# Patient Record
Sex: Male | Born: 1970 | Race: White | Hispanic: No | Marital: Married | Smoking: Never smoker
Health system: Southern US, Community
[De-identification: ages and names within clinical notes are randomized; demographics above are authoritative.]

## PROBLEM LIST (undated history)

## (undated) DIAGNOSIS — K219 Gastro-esophageal reflux disease without esophagitis: Secondary | ICD-10-CM

## (undated) DIAGNOSIS — R51 Headache: Secondary | ICD-10-CM

## (undated) DIAGNOSIS — F329 Major depressive disorder, single episode, unspecified: Secondary | ICD-10-CM

## (undated) DIAGNOSIS — H5440 Blindness, one eye, unspecified eye: Secondary | ICD-10-CM

## (undated) DIAGNOSIS — T7840XA Allergy, unspecified, initial encounter: Secondary | ICD-10-CM

## (undated) DIAGNOSIS — F32A Depression, unspecified: Secondary | ICD-10-CM

## (undated) DIAGNOSIS — R519 Headache, unspecified: Secondary | ICD-10-CM

## (undated) DIAGNOSIS — H269 Unspecified cataract: Secondary | ICD-10-CM

## (undated) DIAGNOSIS — F419 Anxiety disorder, unspecified: Secondary | ICD-10-CM

## (undated) DIAGNOSIS — G43909 Migraine, unspecified, not intractable, without status migrainosus: Secondary | ICD-10-CM

## (undated) DIAGNOSIS — B019 Varicella without complication: Secondary | ICD-10-CM

## (undated) HISTORY — DX: Depression, unspecified: F32.A

## (undated) HISTORY — DX: Major depressive disorder, single episode, unspecified: F32.9

## (undated) HISTORY — DX: Gastro-esophageal reflux disease without esophagitis: K21.9

## (undated) HISTORY — PX: WISDOM TOOTH EXTRACTION: SHX21

## (undated) HISTORY — DX: Migraine, unspecified, not intractable, without status migrainosus: G43.909

## (undated) HISTORY — DX: Unspecified cataract: H26.9

## (undated) HISTORY — PX: EYE SURGERY: SHX253

## (undated) HISTORY — DX: Varicella without complication: B01.9

## (undated) HISTORY — DX: Blindness, one eye, unspecified eye: H54.40

## (undated) HISTORY — DX: Anxiety disorder, unspecified: F41.9

## (undated) HISTORY — DX: Headache: R51

## (undated) HISTORY — PX: FRACTURE SURGERY: SHX138

## (undated) HISTORY — DX: Headache, unspecified: R51.9

## (undated) HISTORY — DX: Allergy, unspecified, initial encounter: T78.40XA

## (undated) HISTORY — PX: CATARACT EXTRACTION: SUR2

---

## 2016-06-06 ENCOUNTER — Ambulatory Visit (INDEPENDENT_AMBULATORY_CARE_PROVIDER_SITE_OTHER): Payer: Self-pay | Admitting: Medical

## 2016-06-06 ENCOUNTER — Encounter: Payer: Self-pay | Admitting: Medical

## 2016-06-06 VITALS — BP 125/84 | HR 101 | Temp 98.0°F | Resp 16 | Ht 72.5 in | Wt 207.6 lb

## 2016-06-06 DIAGNOSIS — F419 Anxiety disorder, unspecified: Secondary | ICD-10-CM

## 2016-06-06 DIAGNOSIS — F329 Major depressive disorder, single episode, unspecified: Secondary | ICD-10-CM

## 2016-06-06 DIAGNOSIS — K219 Gastro-esophageal reflux disease without esophagitis: Secondary | ICD-10-CM | POA: Diagnosis not present

## 2016-06-06 DIAGNOSIS — R51 Headache: Secondary | ICD-10-CM | POA: Diagnosis not present

## 2016-06-06 DIAGNOSIS — R519 Headache, unspecified: Secondary | ICD-10-CM

## 2016-06-06 DIAGNOSIS — F32A Depression, unspecified: Secondary | ICD-10-CM

## 2016-06-06 MED ORDER — BUTALBITAL-APAP-CAFFEINE 50-325-40 MG PO TABS: 1.0000 | ORAL_TABLET | Freq: Four times a day (QID) | ORAL | 0 refills | Status: DC | PRN

## 2016-06-06 MED ORDER — SERTRALINE HCL 50 MG PO TABS: 50.0000 mg | ORAL_TABLET | Freq: Every day | ORAL | 3 refills | Status: DC

## 2016-06-06 MED ORDER — OMEPRAZOLE 40 MG PO CPDR: 40.0000 mg | DELAYED_RELEASE_CAPSULE | Freq: Every day | ORAL | 3 refills | Status: DC

## 2016-06-06 NOTE — Patient Instructions (Signed)
For depression and anxiety will refill sertraline. You appear to have good mood but if you worsen please notify us.  For gerd rx refill omeprazole.   For headache will make fiorcet available this may help with migraine and tension ha.  Follow up in one month CPE or as needed

## 2016-06-06 NOTE — Progress Notes (Signed)
Subjective:    Patient ID: Douglas Landry, male    DOB: 14-Apr-1970, 46 y.o.   MRN: [id number]  HPI  I have reviewed pt PMH, PSH, FH, Social History and Surgical History.  Pt states he recently moved from [place].   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 Married- 3 children  He has been sertraline 50 mg a day. He has been taking med for 4 years now. Takes for stress, depression and anxiety. He states it helps. Has tried to get off but does much better with med.  Pt states his heart burn is moderate controlled. He states recently on zantac. He thinks not adequate. He used to be on omeprazole.   Occasion ha. He states migraine. Last headache he had responded to ibuprofen. In [place] work up revealed maybe tension related ha. He sometimes use product in [place] that had codeine, tyelnol and ibuprofen combination. Pt had imaging of head. Maybe mri and was negative.  Pt last physical was over a year ago.  Left hand fracture last year. He needed surgery and reset bone. Pins placed.     Review of Systems  Constitutional: Negative for chills, fatigue and fever.  HENT: Negative for congestion, ear discharge, ear pain, mouth sores, nosebleeds, rhinorrhea, sinus pain, sinus pressure, sneezing, sore throat and trouble swallowing.   Respiratory: Negative for cough, chest tightness and shortness of breath.   Cardiovascular: Negative for chest pain and palpitations.  Gastrointestinal: Positive for abdominal pain. Negative for abdominal distention, nausea and vomiting.  Genitourinary: Negative for dysuria, enuresis, flank pain, frequency, penile swelling, scrotal swelling and testicular pain.  Musculoskeletal: Negative for back pain and joint swelling.  Neurological: Negative for dizziness, seizures, syncope, speech difficulty, weakness, light-headedness and headaches.       See hpi.  Hematological: Negative for  adenopathy. Does not bruise/bleed easily.  Psychiatric/Behavioral: Positive for dysphoric mood. Negative for agitation, behavioral problems, confusion, self-injury, sleep disturbance and suicidal ideas. The patient is nervous/anxious.     Past Medical History:  Diagnosis Date  . Blind right eye   . Cataract   . Chicken pox   . Depression   . Frequent headaches   . GERD (gastroesophageal reflux disease)   . Migraines      Social History   Social History  . Marital status: Married    Spouse name: N/A  . Number of children: N/A  . Years of education: N/A   Occupational History  . IT specialist    Social History Main Topics  . Smoking status: Never Smoker  . Smokeless tobacco: Never Used  . Alcohol use No  . Drug use: No  . Sexual activity: Yes   Other Topics Concern  . Not on file   Social History Narrative  . No narrative on file    Past Surgical History:  Procedure Laterality Date  . 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 FRACTURE SURGERY Left    left hand fracture reset 2017  . WISDOM TOOTH EXTRACTION      Family History  Problem Relation Age of Onset  . Arthritis Mother   . Hyperlipidemia Mother   . Hypertension Mother   . Depression Mother   . Heart disease Father   . Arthritis Maternal Grandfather     Allergies not on file  No current outpatient prescriptions on file prior to  visit.   No current facility-administered medications on file prior to visit.     BP 125/84   Pulse (!) 101   Temp 98 F (36.7 C) (Oral)   Resp 16   Ht 6' 0.5" (1.842 m)   Wt 207 lb 9.6 oz (94.2 kg)   SpO2 98%   BMI 27.77 kg/m       Objective:   Physical Exam  General Mental Status- Alert. General Appearance- Not in acute distress.   Skin General: Color- Normal Color. Moisture- Normal Moisture.  Neck Carotid Arteries- Normal color. Moisture- Normal Moisture. No carotid bruits. No JVD.  Chest and Lung Exam Auscultation: Breath  Sounds:-Normal.  Cardiovascular Auscultation:Rythm- Regular. Murmurs & Other Heart Sounds:Auscultation of the heart reveals- No Murmurs.  Abdomen Inspection:-Inspeection Normal. Palpation/Percussion:Note:No mass. Palpation and Percussion of the abdomen reveal- Non Tender, Non Distended + BS, no rebound or guarding.    Neurologic Cranial Nerve exam:- CN III-XII intact(No nystagmus), symmetric smile. Strength:- 5/5 equal and symmetric strength both upper and lower extremities.      Assessment & Plan:  For depression and anxiety will refill sertraline. You appear to have good mood but if you worsen please notify us.  For gerd rx refill omeprazole.   For headache will make fiorcet available this may help with migraine and tension ha.  Follow up in one month CPE or as needed  Eaven Schwager, Ramon DredgeEdward, VF CorporationPA-C

## 2016-06-06 NOTE — Progress Notes (Signed)
Pre visit review using our clinic review tool, if applicable. No additional management support is needed unless otherwise documented below in the visit note. 

## 2016-06-17 DIAGNOSIS — J019 Acute sinusitis, unspecified: Secondary | ICD-10-CM | POA: Diagnosis not present

## 2016-07-16 ENCOUNTER — Ambulatory Visit (INDEPENDENT_AMBULATORY_CARE_PROVIDER_SITE_OTHER): Payer: BLUE CROSS/BLUE SHIELD | Admitting: Medical

## 2016-07-16 ENCOUNTER — Encounter: Payer: Self-pay | Admitting: Medical

## 2016-07-16 VITALS — BP 113/78 | HR 76 | Temp 97.8°F | Resp 16 | Ht 75.0 in | Wt 206.2 lb

## 2016-07-16 DIAGNOSIS — Z0000 Encounter for general adult medical examination without abnormal findings: Secondary | ICD-10-CM

## 2016-07-16 DIAGNOSIS — Z113 Encounter for screening for infections with a predominantly sexual mode of transmission: Secondary | ICD-10-CM | POA: Diagnosis not present

## 2016-07-16 LAB — COMPREHENSIVE METABOLIC PANEL
ALK PHOS: 91 U/L (ref 39–117)
ALT: 22 U/L (ref 0–53)
AST: 21 U/L (ref 0–37)
Albumin: 4.6 g/dL (ref 3.5–5.2)
BUN: 13 mg/dL (ref 6–23)
CO2: 29 meq/L (ref 19–32)
CREATININE: 1.03 mg/dL (ref 0.40–1.50)
Calcium: 9.9 mg/dL (ref 8.4–10.5)
Chloride: 103 mEq/L (ref 96–112)
GFR: 83.07 mL/min (ref >60.00)
GLUCOSE: 97 mg/dL (ref 70–99)
Potassium: 4 mEq/L (ref 3.5–5.1)
Sodium: 138 mEq/L (ref 135–145)
Total Bilirubin: 0.7 mg/dL (ref 0.2–1.2)
Total Protein: 7.5 g/dL (ref 6.0–8.3)

## 2016-07-16 LAB — CBC WITH DIFFERENTIAL/PLATELET
BASOS ABS: 0 10*3/uL (ref 0.0–0.1)
Basophils Relative: 0.5 % (ref 0.0–3.0)
Eosinophils Absolute: 0.1 10*3/uL (ref 0.0–0.7)
Eosinophils Relative: 1.6 % (ref 0.0–5.0)
HEMATOCRIT: 45.6 % (ref 39.0–52.0)
HEMOGLOBIN: 15.2 g/dL (ref 13.0–17.0)
LYMPHS ABS: 2 10*3/uL (ref 0.7–4.0)
Lymphocytes Relative: 33.8 % (ref 12.0–46.0)
MCHC: 33.4 g/dL (ref 30.0–36.0)
MCV: 87 fl (ref 78.0–100.0)
MONO ABS: 0.6 10*3/uL (ref 0.1–1.0)
MONOS PCT: 10.1 % (ref 3.0–12.0)
Neutro Abs: 3.2 10*3/uL (ref 1.4–7.7)
Neutrophils Relative %: 54 % (ref 43.0–77.0)
PLATELETS: 171 10*3/uL (ref 150.0–400.0)
RBC: 5.24 Mil/uL (ref 4.22–5.81)
RDW: 13.7 % (ref 11.5–15.5)
WBC: 6 10*3/uL (ref 4.0–10.5)

## 2016-07-16 LAB — LIPID PANEL
CHOL/HDL RATIO: 5
CHOLESTEROL: 221 mg/dL — AB (ref 0–200)
HDL: 43.2 mg/dL (ref >39.00)
LDL CALC: 150 mg/dL — AB (ref 0–99)
NonHDL: 177.71
TRIGLYCERIDES: 138 mg/dL (ref 0.0–149.0)
VLDL: 27.6 mg/dL (ref 0.0–40.0)

## 2016-07-16 LAB — POC URINALSYSI DIPSTICK (AUTOMATED)
Bilirubin, UA: NEGATIVE
Blood, UA: NEGATIVE
Glucose, UA: NEGATIVE
KETONES UA: NEGATIVE
Leukocytes, UA: NEGATIVE
Nitrite, UA: NEGATIVE
PH UA: 6 (ref 5.0–8.0)
PROTEIN UA: NEGATIVE
Spec Grav, UA: 1.015 (ref 1.010–1.025)
UROBILINOGEN UA: NEGATIVE U/dL — AB

## 2016-07-16 LAB — TSH: TSH: 2.01 u[IU]/mL (ref 0.35–4.50)

## 2016-07-16 LAB — HIV ANTIBODY (ROUTINE TESTING W REFLEX): HIV: NONREACTIVE

## 2016-07-16 NOTE — Patient Instructions (Addendum)
For you wellness exam today I have ordered cbc, cmp, tsh, lipid panel, ua and hiv.  Vaccine given today tdap  Recommend exercise and healthy diet.  We will let you know lab results as they come in.  Follow up date appointment will be determined after lab review.    Preventive Care 40-64 Years, Male Preventive care refers to lifestyle choices and visits with your health care provider that can promote health and wellness. What does preventive care include?  A yearly physical exam. This is also called an annual well check.  Dental exams once or twice a year.  Routine eye exams. Ask your health care provider how often you should have your eyes checked.  Personal lifestyle choices, including:  Daily care of your teeth and gums.  Regular physical activity.  Eating a healthy diet.  Avoiding tobacco and drug use.  Limiting alcohol use.  Practicing safe sex.  Taking low-dose aspirin every day starting at age 21. What happens during an annual well check? The services and screenings done by your health care provider during your annual well check will depend on your age, overall health, lifestyle risk factors, and family history of disease. Counseling  Your health care provider may ask you questions about your:  Alcohol use.  Tobacco use.  Drug use.  Emotional well-being.  Home and relationship well-being.  Sexual activity.  Eating habits.  Work and work Statistician. Screening  You may have the following tests or measurements:  Height, weight, and BMI.  Blood pressure.  Lipid and cholesterol levels. These may be checked every 5 years, or more frequently if you are over 97 years old.  Skin check.  Lung cancer screening. You may have this screening every year starting at age 6 if you have a 30-pack-year history of smoking and currently smoke or have quit within the past 15 years.  Fecal occult blood test (FOBT) of the stool. You may have this test every year  starting at age 39.  Flexible sigmoidoscopy or colonoscopy. You may have a sigmoidoscopy every 5 years or a colonoscopy every 10 years starting at age 76.  Prostate cancer screening. Recommendations will vary depending on your family history and other risks.  Hepatitis C blood test.  Hepatitis B blood test.  Sexually transmitted disease (STD) testing.  Diabetes screening. This is done by checking your blood sugar (glucose) after you have not eaten for a while (fasting). You may have this done every 1-3 years. Discuss your test results, treatment options, and if necessary, the need for more tests with your health care provider. Vaccines  Your health care provider may recommend certain vaccines, such as:  Influenza vaccine. This is recommended every year.  Tetanus, diphtheria, and acellular pertussis (Tdap, Td) vaccine. You may need a Td booster every 10 years.  Varicella vaccine. You may need this if you have not been vaccinated.  Zoster vaccine. You may need this after age 59.  Measles, mumps, and rubella (MMR) vaccine. You may need at least one dose of MMR if you were born in 1957 or later. You may also need a second dose.  Pneumococcal 13-valent conjugate (PCV13) vaccine. You may need this if you have certain conditions and have not been vaccinated.  Pneumococcal polysaccharide (PPSV23) vaccine. You may need one or two doses if you smoke cigarettes or if you have certain conditions.  Meningococcal vaccine. You may need this if you have certain conditions.  Hepatitis A vaccine. You may need this if you have  certain conditions or if you travel or work in places where you may be exposed to hepatitis A.  Hepatitis B vaccine. You may need this if you have certain conditions or if you travel or work in places where you may be exposed to hepatitis B.  Haemophilus influenzae type b (Hib) vaccine. You may need this if you have certain risk factors. Talk to your health care provider  about which screenings and vaccines you need and how often you need them. This information is not intended to replace advice given to you by your health care provider. Make sure you discuss any questions you have with your health care provider. Document Released: 04/01/2015 Document Revised: 11/23/2015 Document Reviewed: 01/04/2015 Elsevier Interactive Patient Education  2017 [employer].

## 2016-07-16 NOTE — Progress Notes (Signed)
Pre visit review using our clinic review tool, if applicable. No additional management support is needed unless otherwise documented below in the visit note. 

## 2016-07-16 NOTE — Progress Notes (Signed)
Subjective:    Patient ID: Douglas Landry, male    DOB: 1971/01/10, 46 y.o.   MRN: [id number]  HPI    I have reviewed pt PMH, PSH, FH, Social History and Surgical History.  Pt needs tdap per epic. Pt ok with tdap.  Pt states he recently moved from [place].   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 Married- 3 children.    Pt ok getting hiv screen along with standard labs.    Review of Systems  Constitutional: Negative for chills, fatigue and fever.  HENT: Negative for congestion, ear discharge, ear pain, hearing loss, mouth sores, nosebleeds, postnasal drip, sinus pressure, sneezing, sore throat and trouble swallowing.   Respiratory: Negative for cough, chest tightness, shortness of breath and wheezing.   Cardiovascular: Negative for chest pain and palpitations.  Gastrointestinal: Negative for abdominal pain, anal bleeding, nausea and vomiting.  Genitourinary: Negative for difficulty urinating, discharge, enuresis, flank pain, frequency, hematuria, penile pain, penile swelling and testicular pain.  Musculoskeletal: Negative for back pain, gait problem, joint swelling and neck stiffness.  Skin: Negative for rash.  Neurological: Negative for dizziness, syncope, speech difficulty, weakness and headaches.  Hematological: Negative for adenopathy. Does not bruise/bleed easily.  Psychiatric/Behavioral: Negative for behavioral problems and confusion.    Past Medical History:  Diagnosis Date  . Blind right eye   . Cataract   . Chicken pox   . Depression   . Frequent headaches   . GERD (gastroesophageal reflux disease)   . Migraines      Social History   Social History  . Marital status: Married    Spouse name: N/A  . Number of children: N/A  . Years of education: N/A   Occupational History  . IT specialist    Social History Main Topics  . Smoking status: Never Smoker  .  Smokeless tobacco: Never Used  . Alcohol use No  . Drug use: No  . Sexual activity: Yes   Other Topics Concern  . Not on file   Social History Narrative  . No narrative on file    Past Surgical History:  Procedure Laterality Date  . 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 FRACTURE SURGERY Left    left hand fracture reset 2017  . WISDOM TOOTH EXTRACTION      Family History  Problem Relation Age of Onset  . Arthritis Mother   . Hyperlipidemia Mother   . Hypertension Mother   . Depression Mother   . Heart disease Father   . Arthritis Maternal Grandfather     Not on File  Current Outpatient Prescriptions on File Prior to Visit  Medication Sig Dispense Refill  . butalbital-acetaminophen-caffeine (FIORICET, ESGIC) 50-325-40 MG tablet Take 1-2 tablets by mouth every 6 (six) hours as needed for headache. 8 tablet 0  . omeprazole (PRILOSEC) 40 MG capsule Take 1 capsule (40 mg total) by mouth daily. 30 capsule 3  . RaNITidine HCl (ZANTAC PO) Take 1 tablet by mouth daily.    . sertraline (ZOLOFT) 50 MG tablet Take 1 tablet (50 mg total) by mouth daily. 30 tablet 3   No current facility-administered medications on file prior to visit.     BP 113/78 (BP Location: Left Arm, Patient Position: Sitting, Cuff Size: Large)   Pulse 76   Temp 97.8 F (36.6 C) (Oral)  Resp 16   Ht  (1.905 m)   Wt 206 lb 3.2 oz (93.5 kg)   SpO2 100%   BMI 25.77 kg/m       Objective:   Physical Exam  General  Mental Status - Alert. General Appearance - Well groomed. Not in acute distress.  Skin Rashes- No Rashes.  HEENT Head- Normal. Ear Auditory Canal - Left- Normal. Right - Normal.Tympanic Membrane- Left- Normal. Right- Normal. Eye Sclera/Conjunctiva- Left- Normal. Right- Normal. Nose & Sinuses Nasal Mucosa- Left-  Boggy and Congested. Right-  Boggy and  Congested.Bilateral maxillary and frontal sinus pressure. Mouth & Throat Lips: Upper Lip- Normal: no  dryness, cracking, pallor, cyanosis, or vesicular eruption. Lower Lip-Normal: no dryness, cracking, pallor, cyanosis or vesicular eruption. Buccal Mucosa- Bilateral- No Aphthous ulcers. Oropharynx- No Discharge or Erythema. Tonsils: Characteristics- Bilateral- No Erythema or Congestion. Size/Enlargement- Bilateral- No enlargement. Discharge- bilateral-None.  Neck Neck- Supple. No Masses.   Chest and Lung Exam Auscultation: Breath Sounds:-Clear even and unlabored.  Cardiovascular Auscultation:Rythm- Regular, rate and rhythm. Murmurs & Other Heart Sounds:Ausculatation of the heart reveal- No Murmurs.  Lymphatic Head & Neck General Head & Neck Lymphatics: Bilateral: Description- No Localized lymphadenopathy.   Genital exam- no hernia on exam of inguinal canals. Testicle normal and nontender.     Assessment & Plan:  For you wellness exam today I have ordered cbc, cmp, tsh, lipid panel, ua and hiv.  Vaccine given today tdap  Recommend exercise and healthy diet.  We will let you know lab results as they come in.  Follow up date appointment will be determined after lab review.  Engelbert Sevin, Ramon Dredge, PA-C

## 2016-07-26 ENCOUNTER — Other Ambulatory Visit: Payer: Self-pay

## 2016-07-26 MED ORDER — OMEPRAZOLE 40 MG PO CPDR: 40.0000 mg | DELAYED_RELEASE_CAPSULE | Freq: Every day | ORAL | 0 refills | Status: DC

## 2016-07-26 MED ORDER — SERTRALINE HCL 50 MG PO TABS: 50.0000 mg | ORAL_TABLET | Freq: Every day | ORAL | 0 refills | Status: DC

## 2016-07-26 NOTE — Telephone Encounter (Signed)
Pharmacy requesting 90 day supply of Omeprazole and Sertraline. Please advise

## 2016-07-26 NOTE — Telephone Encounter (Signed)
Rx refill of his omeprazole and sertraline.

## 2016-08-26 DIAGNOSIS — S53491A Other sprain of right elbow, initial encounter: Secondary | ICD-10-CM | POA: Diagnosis not present

## 2016-10-28 ENCOUNTER — Other Ambulatory Visit: Payer: Self-pay | Admitting: Medical

## 2016-10-31 ENCOUNTER — Encounter: Payer: Self-pay | Admitting: Medical

## 2016-11-02 ENCOUNTER — Ambulatory Visit (INDEPENDENT_AMBULATORY_CARE_PROVIDER_SITE_OTHER): Payer: BLUE CROSS/BLUE SHIELD | Admitting: Medical

## 2016-11-02 ENCOUNTER — Encounter: Payer: Self-pay | Admitting: Medical

## 2016-11-02 VITALS — BP 125/80 | HR 74 | Temp 97.8°F | Resp 18 | Ht 73.0 in | Wt 213.4 lb

## 2016-11-02 DIAGNOSIS — R41840 Attention and concentration deficit: Secondary | ICD-10-CM

## 2016-11-02 DIAGNOSIS — F329 Major depressive disorder, single episode, unspecified: Secondary | ICD-10-CM | POA: Diagnosis not present

## 2016-11-02 DIAGNOSIS — F32A Depression, unspecified: Secondary | ICD-10-CM

## 2016-11-02 DIAGNOSIS — F419 Anxiety disorder, unspecified: Secondary | ICD-10-CM

## 2016-11-02 MED ORDER — CLONAZEPAM 0.5 MG PO TABS: 0.5000 mg | ORAL_TABLET | Freq: Two times a day (BID) | ORAL | 0 refills | Status: DC | PRN

## 2016-11-02 NOTE — Progress Notes (Signed)
Subjective:    Patient ID: Douglas Landry, male    DOB: 1970/05/12, 46 y.o.   MRN: [id number]  HPI  Pt in states he has been agitated recently and difficulty concentrating. Pt states his wife has ADD and he used on of his wife ritalin. His concentration was focused but emotionally he felt indifferent. He feeling some anxiety but no panic attacks. He is aware he should never take other persons med.(I explained to him controlled med rule and policies)  In past has had bouts of depression but no dx of bipolar.   Depression started around 2009 related to work related stress. No thoughts of suicide or harm to others but in 2009 had suicidal thought but no attempt.  In the past he tried to stop sertraline but never could.  He would feel worse.  Pt wants to be referred Dr Rene Kocher psychiatrist.   Review of Systems  Constitutional: Negative for chills, fatigue and fever.  Respiratory: Negative for cough, chest tightness and shortness of breath.   Cardiovascular: Negative for chest pain and palpitations.  Gastrointestinal: Negative for abdominal pain and blood in stool.  Genitourinary: Negative for dysuria, flank pain, frequency, genital sores, scrotal swelling, testicular pain and urgency.  Musculoskeletal: Negative for back pain, gait problem and neck pain.  Skin: Negative for rash.  Neurological: Negative for dizziness, syncope, speech difficulty, weakness and headaches.  Hematological: Negative for adenopathy. Does not bruise/bleed easily.  Psychiatric/Behavioral: Positive for dysphoric mood. Negative for agitation, behavioral problems, confusion and sleep disturbance. The patient is nervous/anxious.     Past Medical History:  Diagnosis Date  . Blind right eye   . Cataract   . Chicken pox   . Depression   . Frequent headaches   . GERD (gastroesophageal reflux disease)   . Migraines      Social History   Social History  . Marital status: Married    Spouse name: N/A  . Number of  children: N/A  . Years of education: N/A   Occupational History  . IT specialist    Social History Main Topics  . Smoking status: Never Smoker  . Smokeless tobacco: Never Used  . Alcohol use No  . Drug use: No  . Sexual activity: Yes   Other Topics Concern  . Not on file   Social History Narrative  . No narrative on file    Past Surgical History:  Procedure Laterality Date  . 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 FRACTURE SURGERY Left    left hand fracture reset 2017  . WISDOM TOOTH EXTRACTION      Family History  Problem Relation Age of Onset  . Arthritis Mother   . Hyperlipidemia Mother   . Hypertension Mother   . Depression Mother   . Heart disease Father   . Arthritis Maternal Grandfather     Not on File  Current Outpatient Prescriptions on File Prior to Visit  Medication Sig Dispense Refill  . butalbital-acetaminophen-caffeine (FIORICET, ESGIC) 50-325-40 MG tablet Take 1-2 tablets by mouth every 6 (six) hours as needed for headache. 8 tablet 0  . omeprazole (PRILOSEC) 40 MG capsule TAKE 1 CAPSULE BY MOUTH EVERY DAY 90 capsule 0   No current facility-administered medications on file prior to visit.     BP 125/80 (BP Location: Left Arm, Patient Position: Sitting, Cuff Size: Normal)   Pulse 74   Temp 97.8 F (36.6 C) (Oral)   Resp 18  Ht 6\' 1"  (1.854 m)   Wt 213 lb 6.4 oz (96.8 kg)   SpO2 99%   BMI 28.15 kg/m       Objective:   Physical Exam   General Mental Status- Alert. General Appearance- Not in acute distress.   Skin General: Color- Normal Color. Moisture- Normal Moisture.  Neck Carotid Arteries- Normal color. Moisture- Normal Moisture. No carotid bruits. No JVD.  Chest and Lung Exam Auscultation: Breath Sounds:-Normal.  Cardiovascular Auscultation:Rythm- Regular. Murmurs & Other Heart Sounds:Auscultation of the heart reveals- No Murmurs.  Abdomen Inspection:-Inspeection  Normal. Palpation/Percussion:Note:No mass. Palpation and Percussion of the abdomen reveal- Non Tender, Non Distended + BS, no rebound or guarding.   Neurologic Cranial Nerve exam:- CN III-XII intact(No nystagmus), symmetric smile. Strength:- 5/5 equal and symmetric strength both upper and lower extremities.     Assessment & Plan:  For your depression continue current dose. Will see how you do. If referral is delayed to psychiatrist might increase your dose to 100 mg.  During interim if you get real agitated/anxious will rx limited number of klonopin. Use sparingly.  If any thoughts of suicide or harm to other seek evaluation. Best place is [facility].  Make call to psychiatrist office. Then call here and speak with Victorino Dike or Silva Bandy update them that you have made contact then they can follow up with Dr., Rene Kocher office.  Follow up with up with [place] 2-4 weeks.  Amritha Yorke, Ramon Dredge, PA-C

## 2016-11-02 NOTE — Patient Instructions (Addendum)
For your depression continue current dose. Will see how you do. If referral is delayed to psychiatrist might increase your dose to 100 mg.  During interim if you get real agitated/anxious will rx limited number of klonopin. Use sparingly.  If any thoughts of suicide or harm to other seek evaluation. Best place is [facility].  Make call to psychiatrist office. Then call here and speak with Victorino Dike or Silva Bandy update them that you have made contact then they can follow up with Dr., Rene Kocher office.  Follow up with up with [place] 2-4 weeks.

## 2016-11-21 ENCOUNTER — Ambulatory Visit (INDEPENDENT_AMBULATORY_CARE_PROVIDER_SITE_OTHER): Payer: BLUE CROSS/BLUE SHIELD | Admitting: Medical

## 2016-11-21 ENCOUNTER — Ambulatory Visit (HOSPITAL_BASED_OUTPATIENT_CLINIC_OR_DEPARTMENT_OTHER)
Admission: RE | Admit: 2016-11-21 | Discharge: 2016-11-21 | Disposition: A | Discharge status: Home / Self Care | Payer: BLUE CROSS/BLUE SHIELD | Source: Ambulatory Visit | Attending: Medical | Admitting: Medical

## 2016-11-21 ENCOUNTER — Encounter: Payer: Self-pay | Admitting: Medical

## 2016-11-21 ENCOUNTER — Ambulatory Visit (HOSPITAL_BASED_OUTPATIENT_CLINIC_OR_DEPARTMENT_OTHER): Admission: RE | Admit: 2016-11-21 | Payer: Self-pay | Source: Ambulatory Visit

## 2016-11-21 VITALS — BP 122/82 | HR 90 | Temp 97.7°F | Resp 16 | Ht 73.0 in | Wt 210.2 lb

## 2016-11-21 DIAGNOSIS — N50812 Left testicular pain: Secondary | ICD-10-CM

## 2016-11-21 DIAGNOSIS — I861 Scrotal varices: Secondary | ICD-10-CM | POA: Insufficient documentation

## 2016-11-21 DIAGNOSIS — R102 Pelvic and perineal pain: Secondary | ICD-10-CM

## 2016-11-21 DIAGNOSIS — N3943 Post-void dribbling: Secondary | ICD-10-CM

## 2016-11-21 DIAGNOSIS — F419 Anxiety disorder, unspecified: Secondary | ICD-10-CM

## 2016-11-21 DIAGNOSIS — F32A Depression, unspecified: Secondary | ICD-10-CM

## 2016-11-21 DIAGNOSIS — F329 Major depressive disorder, single episode, unspecified: Secondary | ICD-10-CM

## 2016-11-21 LAB — PSA: PSA: 0.76 ng/mL (ref 0.10–4.00)

## 2016-11-21 MED ORDER — OXYBUTYNIN CHLORIDE 5 MG PO TABS: 5.0000 mg | ORAL_TABLET | Freq: Two times a day (BID) | ORAL | 0 refills | Status: DC

## 2016-11-21 NOTE — Progress Notes (Signed)
Subjective:    Patient ID: Douglas Landry, male    DOB: [birth year], 46 y.o.   MRN: [id number]  HPI  Pt in for a follow up on his mood/depression. Pt has appointment with psychiatrist early October.  Pt is on sertraline and klonopin. Pt states doing well on sertraline 50 mg dose. He states he is only using klonopin only 3 tabs.  Overall mood and anxiety better overall.  See hpi for his additional complaint.     Review of Systems  Constitutional: Negative for chills, fatigue and fever.  HENT: Negative for congestion, drooling, ear pain, hearing loss, nosebleeds, rhinorrhea, sinus pressure and sore throat.   Respiratory: Negative for cough, chest tightness, shortness of breath and wheezing.   Cardiovascular: Negative for chest pain and palpitations.  Gastrointestinal: Negative for abdominal pain, blood in stool, constipation, diarrhea, nausea and vomiting.  Genitourinary: Negative for decreased urine volume, difficulty urinating, discharge, dysuria, enuresis, flank pain, frequency, genital sores, penile pain, penile swelling, scrotal swelling, testicular pain and urgency.       Some post void dribbling. But no flow issues starting urinary flow.  This has been going on for years. Some mild transient pain left testicle for 2 years.  Musculoskeletal: Negative for back pain and neck stiffness.  Skin: Negative for rash.  Neurological: Negative for dizziness, seizures, weakness and headaches.  Hematological: Negative for adenopathy. Does not bruise/bleed easily.  Psychiatric/Behavioral: Negative for behavioral problems, confusion, decreased concentration, dysphoric mood, hallucinations and suicidal ideas. The patient is not nervous/anxious.     Past Medical History:  Diagnosis Date  . Blind right eye   . Cataract   . Chicken pox   . Depression   . Frequent headaches   . GERD (gastroesophageal reflux disease)   . Migraines      Social History   Social History  . Marital  status: Married    Spouse name: N/A  . Number of children: N/A  . Years of education: N/A   Occupational History  . IT specialist    Social History Main Topics  . Smoking status: Never Smoker  . Smokeless tobacco: Never Used  . Alcohol use No  . Drug use: No  . Sexual activity: Yes   Other Topics Concern  . Not on file   Social History Narrative  . No narrative on file    Past Surgical History:  Procedure Laterality Date  . 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 FRACTURE SURGERY Left    left hand fracture reset 2017  . WISDOM TOOTH EXTRACTION      Family History  Problem Relation Age of Onset  . Arthritis Mother   . Hyperlipidemia Mother   . Hypertension Mother   . Depression Mother   . Heart disease Father   . Arthritis Maternal Grandfather     Not on File  Current Outpatient Prescriptions on File Prior to Visit  Medication Sig Dispense Refill  . butalbital-acetaminophen-caffeine (FIORICET, ESGIC) 50-325-40 MG tablet Take 1-2 tablets by mouth every 6 (six) hours as needed for headache. 8 tablet 0  . clonazePAM (KLONOPIN) 0.5 MG tablet Take 1 tablet (0.5 mg total) by mouth 2 (two) times daily as needed for anxiety. 14 tablet 0  . meloxicam (MOBIC) 7.5 MG tablet Take 7.5 mg by mouth 2 (two) times daily.  0  . omeprazole (PRILOSEC) 40 MG capsule TAKE 1 CAPSULE BY MOUTH EVERY DAY 90 capsule 0  . sertraline (ZOLOFT) 50  MG tablet Take 50 mg by mouth daily.  0   No current facility-administered medications on file prior to visit.     BP 122/82   Pulse 90   Temp 97.7 F (36.5 C) (Oral)   Resp 16   Ht 6\' 1"  (1.854 m)   Wt 210 lb 3.2 oz (95.3 kg)   SpO2 99%   BMI 27.73 kg/m        Objective:   Physical Exam  General- No acute distress. Pleasant patient. Neck- Full range of motion, no jvd Lungs- Clear, even and unlabored. Heart- regular rate and rhythm. Neurologic- CNII- XII grossly intact.  Rectal- smooth prostate. Does not feel  enlarged Genital exam- left testicle feels smaller than rt side. Possible varicocele present. Rt side more normal size. No mass.       Assessment & Plan:  Your mood and anxiety is better since past I have last saw him. Please see psychiatrist in early October. If before then you worsen please let us know. Continue current same meds.  For your post void dribbling will get psa check. Rx ditropan see if this helps.  For past testicle pain left side a week ago will get US of scrotum.  Follow up date to be determined after lab review.  Glenys Snader, Ramon DredgeEdward, PA-C

## 2016-11-21 NOTE — Patient Instructions (Addendum)
Your mood and anxiety is better since past I have last saw him. Please see psychiatrist in early October. If before then you worsen please let us know. Continue current same meds.  For your post void dribbling will get psa check. Rx ditropan see if this helps.  For past testicle pain left side a week ago will get US of scrotum.  Follow up date to be determined after lab review.

## 2016-12-02 ENCOUNTER — Other Ambulatory Visit: Payer: Self-pay | Admitting: Medical

## 2016-12-18 ENCOUNTER — Other Ambulatory Visit: Payer: Self-pay | Admitting: Medical

## 2016-12-31 ENCOUNTER — Encounter (HOSPITAL_COMMUNITY): Payer: Self-pay | Admitting: Psychiatry

## 2016-12-31 ENCOUNTER — Ambulatory Visit (INDEPENDENT_AMBULATORY_CARE_PROVIDER_SITE_OTHER): Payer: BLUE CROSS/BLUE SHIELD | Admitting: Psychiatry

## 2016-12-31 VITALS — BP 126/74 | HR 96 | Ht 72.0 in | Wt 211.0 lb

## 2016-12-31 DIAGNOSIS — F331 Major depressive disorder, recurrent, moderate: Secondary | ICD-10-CM | POA: Diagnosis not present

## 2016-12-31 DIAGNOSIS — Z818 Family history of other mental and behavioral disorders: Secondary | ICD-10-CM | POA: Diagnosis not present

## 2016-12-31 DIAGNOSIS — R51 Headache: Secondary | ICD-10-CM | POA: Diagnosis not present

## 2016-12-31 DIAGNOSIS — R450 Nervousness: Secondary | ICD-10-CM | POA: Diagnosis not present

## 2016-12-31 DIAGNOSIS — F411 Generalized anxiety disorder: Secondary | ICD-10-CM

## 2016-12-31 MED ORDER — BUPROPION HCL ER (XL) 150 MG PO TB24: 150.0000 mg | ORAL_TABLET | Freq: Every day | ORAL | 0 refills | Status: DC

## 2016-12-31 NOTE — Progress Notes (Signed)
Psychiatric Initial Adult Assessment   Patient Identification: Douglas Landry MRN:  [id number] Date of Evaluation:  12/31/2016 Referral Source: Self referred.  Chief Complaint:  I have no energy.  My focus and attention is not good. Chief Complaint    Establish Care     Visit Diagnosis:    ICD-10-CM   1. MDD (major depressive disorder), recurrent episode, moderate (HCC) F33.1 buPROPion (WELLBUTRIN XL) 150 MG 24 hr tablet    History of Present Illness:  Patient is 46 year old married, employed man who is self referred for seeking management for his depression and anxiety symptoms.  Patient has long history of depression and he has been seen psychiatrist in [place] few times and prescribed Zoloft which he has taken on and off in the past but recently he is been taking regularly.  Patient moved from [place] in February to [place].  He has a sister and mother who lives in [place].  Patient moved with his wife and 3 children who are 49, 68 and 53 years old.  Patient admitted that he has in the beginning difficulty adjusting to the new place.  He endorse anxiety and nervousness.  He does not like large a crowd and get very anxious and nervous.  His primary care physician continued sertraline and also given Klonopin 0.5 mg which he has taken a few times in past few weeks.  He is not sure what triggered the panic attacks but he has a history of nervousness and anxiety.  His biggest concern is lack of attention, focus, decreased energy and poor concentration.  He gets easily distracted and some time complaining of lack of motivation to do things.  His other stressors are wife has a history of depression and recently she had a hard time adjusting to a new place.  All of her family lives in [place].  She is seeing Dr Gaspar Skeeters.  Patient denies any hallucination, paranoia, mania, suicidal thoughts, mood swing, anger, feeling of hopelessness, nightmares, OCD or any PTSD symptoms.  He is taking  Zoloft 50 mg but wondering if he can try something different to help his focus attention and lack of motivation.  Patient endorse he has a limited social network because he's been busy at work.  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  Patient denies drinking heavy or using any illegal substances.  He has no tremors, shakes or any EPS.  He denies any anger or violence.  Past Psychiatric History: Patient reported history of depression since 2010 when he attended college at [employer] [place] , [place].  At that time he had a homesickness and he saw a therapist.  He started taking medication in 2013 and at that time he was very depressed and having suicidal thoughts because he is having issues at work and the same time wife started using heavy drinking .  He saw briefly psychiatrist in [place] and given sertraline which helps him.  Patient denies any history of psychiatric inpatient treatment, suicidal attempt , mania, psychosis or any hallucination.    Previous Psychotropic Medications: Yes   Substance Abuse History in the last 12 months:  No.  Consequences of Substance Abuse: Negative  Past Medical History:  Past Medical History:  Diagnosis Date  . Blind right eye   . Cataract   . Chicken pox   . Depression   . Frequent headaches   . GERD (gastroesophageal reflux disease)   . Migraines     Past Surgical  History:  Procedure Laterality Date  . 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 FRACTURE SURGERY Left    left hand fracture reset 2017  . WISDOM TOOTH EXTRACTION      Family Psychiatric History: Patient reported multiple family member has depression and alcohol problem.  His mother has depression, brother has depression and one uncle from his father's side committed suicide.  Family History:  Family History  Problem Relation Age of Onset  . Arthritis Mother   . Hyperlipidemia Mother   . Hypertension Mother   . Depression Mother   . Heart disease Father    . Arthritis Maternal Grandfather   . Depression Brother   . Suicidality Paternal Grandfather     Social History:   Social History   Social History  . Marital status: Married    Spouse name: N/A  . Number of children: N/A  . Years of education: N/A   Occupational History  . IT specialist    Social History Main Topics  . Smoking status: Never Smoker  . Smokeless tobacco: Never Used  . Alcohol use No  . Drug use: No  . Sexual activity: Yes   Other Topics Concern  . None   Social History Narrative  . None    Additional Social History: Patient born in [place] but at age 13 his parents moved [place] where he lived most of his life.  He admitted he did not like school's but he was able to pass his grades with average GPA 2.0 .  He came to [place] to attend college for 4 years and then moved back [place].  In 2013 he briefly came to [place] for few months due to job situation but did not like and went back because he was alone and his wife stayed in [place].  He married once.  He has 3 daughters who are 39, 72 and 16 years old.  Patient recently moved to [place] in February 2018.  His mother and sister live close by in [place].  His parents separated when he was 40 years old and his father moved [place] and remarried.  Patient has very limited contact with his father.  Allergies:  Not on File  Metabolic Disorder Labs: No results found for: HGBA1C, MPG No results found for: PROLACTIN Lab Results  Component Value Date   CHOL 221 (H) 07/16/2016   TRIG 138.0 07/16/2016   HDL 43.20 07/16/2016   CHOLHDL 5 07/16/2016   VLDL 27.6 07/16/2016   LDLCALC 150 (H) 07/16/2016     Current Medications: Current Outpatient Prescriptions  Medication Sig Dispense Refill  . buPROPion (WELLBUTRIN XL) 150 MG 24 hr tablet Take 1 tablet (150 mg total) by mouth daily. 30 tablet 0  . clonazePAM (KLONOPIN) 0.5 MG tablet Take 1 tablet (0.5 mg total) by mouth 2 (two) times daily as needed for anxiety.  14 tablet 0  . omeprazole (PRILOSEC) 40 MG capsule TAKE 1 CAPSULE BY MOUTH EVERY DAY 90 capsule 0  . oxybutynin (DITROPAN) 5 MG tablet TAKE 1 TABLET BY MOUTH TWICE A DAY 60 tablet 0   No current facility-administered medications for this visit.     Neurologic: Headache: Yes Seizure: No Paresthesias:No  Musculoskeletal: Strength & Muscle Tone: within normal limits Gait & Station: normal Patient leans: N/A  Psychiatric Specialty Exam: Review of Systems  Constitutional: Negative.   HENT: Negative.   Eyes:       Loss of vision in  right eye  Respiratory: Negative.   Cardiovascular: Negative.   Musculoskeletal: Negative.   Skin: Negative.   Neurological: Positive for headaches.  Endo/Heme/Allergies: Negative.   Psychiatric/Behavioral: The patient is nervous/anxious.     Blood pressure 126/74, pulse 96, height 6' (1.829 m), weight 211 lb (95.7 kg).Body mass index is 28.62 kg/m.  General Appearance: Casual  Eye Contact:  Good  Speech:  Clear and Coherent  Volume:  Normal  Mood:  Anxious  Affect:  Appropriate  Thought Process:  Goal Directed  Orientation:  Full (Time, Place, and Person)  Thought Content:  Logical  Suicidal Thoughts:  No  Homicidal Thoughts:  No  Memory:  Immediate;   Good Recent;   Good Remote;   Good  Judgement:  Good  Insight:  Good  Psychomotor Activity:  Normal  Concentration:  Concentration: Fair and Attention Span: Fair  Recall:  Good  Fund of Knowledge:Good  Language: Good  Akathisia:  No  Handed:  Right  AIMS (if indicated):  0  Assets:  Communication Skills Desire for Improvement Housing Resilience Social Support  ADL's:  Intact  Cognition: WNL  Sleep:  Good    Assessment: Major depressive disorder, recurrent.  Anxiety disorder NOS.  Plan: I review his symptoms, psychosocial stressors, history, current medication and recent blood work results.  He like to try a different medication which can help his focus, attention and  concentration.  He had never tried Wellbutrin and I recommended to try Wellbutrin XL 150 mg daily.  He will gradually wean him off from sertraline.  He will take 25 mg for 1 week and than stopped.  We discussed medication side effects especially Wellbutrin can cause worsening of headaches, tremors, shakes or EPS.  Patient is not interested in counseling.  I recommended to call us back if he has any question or any concern or if he feel worsening of the symptom.  Discuss safety concern that anytime having active suicidal thoughts or homicidal thoughts and he need to call 911 or go to local emergency room.  Follow-up in 3 weeks.   Davan Hark T., MD 10/15/201810:13 AM

## 2017-01-04 ENCOUNTER — Ambulatory Visit (HOSPITAL_COMMUNITY): Payer: BLUE CROSS/BLUE SHIELD | Admitting: Psychiatry

## 2017-01-21 ENCOUNTER — Other Ambulatory Visit: Payer: Self-pay | Admitting: Medical

## 2017-01-25 ENCOUNTER — Ambulatory Visit (INDEPENDENT_AMBULATORY_CARE_PROVIDER_SITE_OTHER): Payer: BLUE CROSS/BLUE SHIELD | Admitting: Psychiatry

## 2017-01-25 ENCOUNTER — Encounter (HOSPITAL_COMMUNITY): Payer: Self-pay | Admitting: Psychiatry

## 2017-01-25 DIAGNOSIS — F331 Major depressive disorder, recurrent, moderate: Secondary | ICD-10-CM | POA: Diagnosis not present

## 2017-01-25 DIAGNOSIS — Z818 Family history of other mental and behavioral disorders: Secondary | ICD-10-CM

## 2017-01-25 DIAGNOSIS — R51 Headache: Secondary | ICD-10-CM

## 2017-01-25 DIAGNOSIS — R634 Abnormal weight loss: Secondary | ICD-10-CM | POA: Diagnosis not present

## 2017-01-25 DIAGNOSIS — R519 Headache, unspecified: Secondary | ICD-10-CM | POA: Insufficient documentation

## 2017-01-25 MED ORDER — BUPROPION HCL ER (XL) 150 MG PO TB24: 150.0000 mg | ORAL_TABLET | Freq: Every day | ORAL | 1 refills | Status: DC

## 2017-01-25 NOTE — Progress Notes (Signed)
BH MD/PA/NP OP Progress Note  01/25/2017 10:10 AM Douglas SalisburyShannon Landry  MRN:  [id number]  Chief Complaint: I like new medication but sometimes it making me irritable.  HPI: Patient came for his follow-up appointment.  He is 46 year old married employed man who is referred and seen first time 4 weeks ago for the management of his depression.  Patient moved from [place] in February and he was getting medication for his anxiety.  He was on Zoloft but he was taking on and off until he started taking regularly in the past few months.  Patient exhibits symptoms of anxiety and depression.  He had given also Klonopin which he has not taken in recent weeks.  He reported poor attention, concentration, lack of focus, attention, increased sleep and lack of motivation despite taking the Zoloft.  We started him on Wellbutrin.  He really liked the new medication.  He is more active, attentive and able to do multitasking and is happy with the job.  However he noticed some time irritability but slowly and gradually is getting better.  His wife also suffers from depression and she sees Dr. Gaspar SkeetersEskir.  Patient has no tremors, shakes or any EPS.  His sleep is also improved.  He used to take excessive caffeine during the day and he was still having a lot of naps but since started the Wellbutrin he has been doing much better.  Denies any feeling of hopelessness or worthlessness.  He denies any mood swings, agitation or mania.  His appetite is good.  He denies any hallucination.  He is no longer taking Klonopin and Zoloft.  Patient denies drinking alcohol or using any illegal substances.  Lives with his wife and 3 daughters.  He is close to his mother and sister who lives close by.    Visit Diagnosis:    ICD-10-CM   1. MDD (major depressive disorder), recurrent episode, moderate (HCC) F33.1     Past Psychiatric History: Reviewed. Patient had a history of depression since 2010 when he attended college in [place].  He was  prescribed Zoloft which he took on and off.  Patient denies any history of psychiatric inpatient treatment, mania, hallucination but endorsed history of poor attention and concentration.  In the school he has difficulty achieving his grades.   Past Medical History:  Past Medical History:  Diagnosis Date  . Blind right eye   . Cataract   . Chicken pox   . Depression   . Frequent headaches   . GERD (gastroesophageal reflux disease)   . Migraines     Past Surgical History:  Procedure Laterality Date  . 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 FRACTURE SURGERY Left    left hand fracture reset 2017  . WISDOM TOOTH EXTRACTION      Family Psychiatric History: Reviewed.  Family History:  Family History  Problem Relation Age of Onset  . Arthritis Mother   . Hyperlipidemia Mother   . Hypertension Mother   . Depression Mother   . Heart disease Father   . Arthritis Maternal Grandfather   . Depression Brother   . Suicidality Paternal Grandfather     Social History:  Social History   Socioeconomic History  . Marital status: Married    Spouse name: Not on file  . Number of children: Not on file  . Years of education: Not on file  . Highest education level: Not on file  Social Needs  . Physicist, medicalinancial resource  strain: Not on file  . Food insecurity - worry: Not on file  . Food insecurity - inability: Not on file  . Transportation needs - medical: Not on file  . Transportation needs - non-medical: Not on file  Occupational History  . Occupation: IT specialist  Tobacco Use  . Smoking status: Never Smoker  . Smokeless tobacco: Never Used  Substance and Sexual Activity  . Alcohol use: No  . Drug use: No  . Sexual activity: Yes  Other Topics Concern  . Not on file  Social History Narrative  . Not on file    Allergies: Not on File  Metabolic Disorder Labs: No results found for: HGBA1C, MPG No results found for: PROLACTIN Lab Results  Component Value Date    CHOL 221 (H) 07/16/2016   TRIG 138.0 07/16/2016   HDL 43.20 07/16/2016   CHOLHDL 5 07/16/2016   VLDL 27.6 07/16/2016   LDLCALC 150 (H) 07/16/2016   Lab Results  Component Value Date   TSH 2.01 07/16/2016    Therapeutic Level Labs: No results found for: LITHIUM No results found for: VALPROATE No components found for:  CBMZ  Current Medications: Current Outpatient Medications  Medication Sig Dispense Refill  . buPROPion (WELLBUTRIN XL) 150 MG 24 hr tablet Take 1 tablet (150 mg total) by mouth daily. 30 tablet 0  . clonazePAM (KLONOPIN) 0.5 MG tablet Take 1 tablet (0.5 mg total) by mouth 2 (two) times daily as needed for anxiety. 14 tablet 0  . omeprazole (PRILOSEC) 40 MG capsule TAKE 1 CAPSULE BY MOUTH EVERY DAY 90 capsule 0  . oxybutynin (DITROPAN) 5 MG tablet TAKE 1 TABLET BY MOUTH TWICE A DAY 60 tablet 0   No current facility-administered medications for this visit.      Musculoskeletal: Strength & Muscle Tone: within normal limits Gait & Station: normal Patient leans: N/A  Psychiatric Specialty Exam: Review of Systems  Constitutional: Positive for weight loss.  HENT: Negative.   Eyes:       Loss of vision in right eye  Respiratory: Negative.   Musculoskeletal: Negative.     Blood pressure 122/74, pulse 94, height 6\' 1"  (1.854 m), weight 208 lb 3.2 oz (94.4 kg).There is no height or weight on file to calculate BMI.  General Appearance: Negative  Eye Contact:  Good  Speech:  Clear and Coherent  Volume:  Normal  Mood:  Euthymic  Affect:  Appropriate  Thought Process:  Goal Directed  Orientation:  Full (Time, Place, and Person)  Thought Content: Logical   Suicidal Thoughts:  No  Homicidal Thoughts:  No  Memory:  Immediate;   Good Recent;   Good Remote;   Good  Judgement:  Good  Insight:  Good  Psychomotor Activity:  Normal  Concentration:  Concentration: improved and Attention Span: improved  Recall:  improved  Fund of Knowledge: Good  Language: Good   Akathisia:  No  Handed:  Right  AIMS (if indicated): not done  Assets:  Communication Skills Desire for Improvement Housing Resilience Social Support  ADL's:  Intact  Cognition: WNL  Sleep:  improved   Screenings: PHQ2-9     Office Visit from 06/06/2016 in [facility] at [facility]  PHQ-2 Total Score  0       Assessment and Plan: Major depressive disorder, recurrent.  Patient is taking Wellbutrin XL 150 mg daily.  So far is tolerating okay other than he has some irritability.  He noticed improvement in his sleep,  energy, attention, concentration.  He is no longer taking Klonopin and Zoloft.  He is not interested in counseling.  He has no palpitation or any other concerns.  I reviewed blood work results.  Recommended to continue current dose and we will consider increasing the dose on his next appointment if needed.  Recommended to call us back if he has any question or any concern.  Follow-up in 2 months.   Harshika Mago T., MD 01/25/2017, 10:10 AM

## 2017-01-27 ENCOUNTER — Other Ambulatory Visit: Payer: Self-pay | Admitting: Medical

## 2017-02-05 ENCOUNTER — Other Ambulatory Visit: Payer: Self-pay

## 2017-02-05 MED ORDER — OXYBUTYNIN CHLORIDE 5 MG PO TABS: 5.0000 mg | ORAL_TABLET | Freq: Two times a day (BID) | ORAL | 0 refills | Status: DC

## 2017-02-28 ENCOUNTER — Other Ambulatory Visit: Payer: Self-pay | Admitting: Medical

## 2017-02-28 ENCOUNTER — Other Ambulatory Visit (HOSPITAL_COMMUNITY): Payer: Self-pay

## 2017-02-28 DIAGNOSIS — F331 Major depressive disorder, recurrent, moderate: Secondary | ICD-10-CM

## 2017-02-28 NOTE — Telephone Encounter (Signed)
Medication refill request - Fax from patient's pharmacy for a 90 day supply of Bupropion XL 150 mg per insurance requirement.

## 2017-03-01 ENCOUNTER — Other Ambulatory Visit (HOSPITAL_COMMUNITY): Payer: Self-pay | Admitting: Psychiatry

## 2017-03-01 MED ORDER — BUPROPION HCL ER (XL) 150 MG PO TB24: 150.0000 mg | ORAL_TABLET | Freq: Every day | ORAL | 0 refills | Status: DC

## 2017-03-01 NOTE — Telephone Encounter (Signed)
[facility] day order [facility] to [facility]

## 2017-03-01 NOTE — Telephone Encounter (Signed)
Okay to provide 90 day supply. 

## 2017-03-28 ENCOUNTER — Ambulatory Visit (INDEPENDENT_AMBULATORY_CARE_PROVIDER_SITE_OTHER): Payer: BLUE CROSS/BLUE SHIELD | Admitting: Psychiatry

## 2017-03-28 ENCOUNTER — Encounter (HOSPITAL_COMMUNITY): Payer: Self-pay | Admitting: Psychiatry

## 2017-03-28 DIAGNOSIS — F410 Panic disorder [episodic paroxysmal anxiety] without agoraphobia: Secondary | ICD-10-CM | POA: Diagnosis not present

## 2017-03-28 DIAGNOSIS — Z818 Family history of other mental and behavioral disorders: Secondary | ICD-10-CM | POA: Diagnosis not present

## 2017-03-28 DIAGNOSIS — F331 Major depressive disorder, recurrent, moderate: Secondary | ICD-10-CM

## 2017-03-28 MED ORDER — BUPROPION HCL ER (XL) 300 MG PO TB24: 300.0000 mg | ORAL_TABLET | Freq: Every day | ORAL | 1 refills | Status: DC

## 2017-03-28 NOTE — Progress Notes (Signed)
BH MD/PA/NP OP Progress Note  03/28/2017 8:09 AM Douglas Landry  MRN:  [id number]  Chief Complaint: I am doing fine but I continue to have struggle with my energy.  HPI: Patient came for his follow-up appointment.  He is taking Wellbutrin XL 150 mg daily.  Overall he reported his depression is improved from the past and he has no longer having panic attacks but he continues to struggle with his lack of energy, fatigue and ruminative thoughts.  He believe his Wellbutrin is phasing out and wondering if dose can further increase.  He has not taken Klonopin in a while and is happy about it because he does not have any panic attacks.  He had a good Christmas.  He also endorsed snoring while asleep and sometime he have gasping for air in the night.  He denies any paranoia, hallucination, suicidal thoughts.  He denies any crying spells or any feeling of hopelessness or worthlessness.  His appetite is okay.  He denies any self abusive behavior.  He has no tremors shakes or any EPS.  Visit Diagnosis:    ICD-10-CM   1. MDD (major depressive disorder), recurrent episode, moderate (HCC) F33.1 buPROPion (WELLBUTRIN XL) 300 MG 24 hr tablet    Past Psychiatric History: Reviewed. Patient had a history of depression since 2010 when he attended college in [place].  He was prescribed Zoloft which he took on and off.  Patient denies any history of psychiatric inpatient treatment, mania, hallucination but endorsed history of poor attention and concentration.  In the school he has difficulty achieving his grades.   Past Medical History:  Past Medical History:  Diagnosis Date  . Blind right eye   . Cataract   . Chicken pox   . Depression   . Frequent headaches   . GERD (gastroesophageal reflux disease)   . Migraines     Past Surgical History:  Procedure Laterality Date  . 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 FRACTURE SURGERY Left    left hand fracture reset 2017  . WISDOM TOOTH  EXTRACTION      Family Psychiatric History: Reviewed.  Family History:  Family History  Problem Relation Age of Onset  . Arthritis Mother   . Hyperlipidemia Mother   . Hypertension Mother   . Depression Mother   . Heart disease Father   . Arthritis Maternal Grandfather   . Depression Brother   . Suicidality Paternal Grandfather     Social History:  Social History   Socioeconomic History  . Marital status: Married    Spouse name: None  . Number of children: None  . Years of education: None  . Highest education level: None  Social Needs  . Financial resource strain: None  . Food insecurity - worry: None  . Food insecurity - inability: None  . Transportation needs - medical: None  . Transportation needs - non-medical: None  Occupational History  . Occupation: IT specialist  Tobacco Use  . Smoking status: Never Smoker  . Smokeless tobacco: Never Used  Substance and Sexual Activity  . Alcohol use: No  . Drug use: No  . Sexual activity: Yes    Partners: Female  Other Topics Concern  . None  Social History Narrative  . None    Allergies: Not on File  Metabolic Disorder Labs: No results found for: HGBA1C, MPG No results found for: PROLACTIN Lab Results  Component Value Date   CHOL 221 (H) 07/16/2016  TRIG 138.0 07/16/2016   HDL 43.20 07/16/2016   CHOLHDL 5 07/16/2016   VLDL 27.6 07/16/2016   LDLCALC 150 (H) 07/16/2016   Lab Results  Component Value Date   TSH 2.01 07/16/2016    Therapeutic Level Labs: No results found for: LITHIUM No results found for: VALPROATE No components found for:  CBMZ  Current Medications: Current Outpatient Medications  Medication Sig Dispense Refill  . buPROPion (WELLBUTRIN XL) 150 MG 24 hr tablet Take 1 tablet (150 mg total) by mouth daily. 90 tablet 0  . clonazePAM (KLONOPIN) 0.5 MG tablet Take 1 tablet (0.5 mg total) by mouth 2 (two) times daily as needed for anxiety. 14 tablet 0  . omeprazole (PRILOSEC) 40 MG  capsule TAKE 1 CAPSULE BY MOUTH EVERY DAY 90 capsule 0  . oxybutynin (DITROPAN) 5 MG tablet Take 1 tablet (5 mg total) 2 (two) times daily by mouth. 180 tablet 0  . sertraline (ZOLOFT) 50 MG tablet TAKE 1 TABLET BY MOUTH EVERY DAY 90 tablet 0   No current facility-administered medications for this visit.      Musculoskeletal: Strength & Muscle Tone: within normal limits Gait & Station: normal Patient leans: N/A  Psychiatric Specialty Exam: Review of Systems  Eyes:       Loss of vision in right eye    Blood pressure 110/76, pulse 90, height 6' 0.5" (1.842 m), weight 210 lb (95.3 kg), SpO2 97 %.Body mass index is 28.09 kg/m.  General Appearance: Casual  Eye Contact:  Good  Speech:  Slow  Volume:  Normal  Mood:  Anxious and Dysphoric  Affect:  Congruent  Thought Process:  Goal Directed  Orientation:  Full (Time, Place, and Person)  Thought Content: Logical   Suicidal Thoughts:  No  Homicidal Thoughts:  No  Memory:  Immediate;   Good Recent;   Good Remote;   Good  Judgement:  Good  Insight:  Good  Psychomotor Activity:  Normal  Concentration:  Concentration: Good and Attention Span: Good  Recall:  Good  Fund of Knowledge: Good  Language: Good  Akathisia:  No  Handed:  Right  AIMS (if indicated): not done  Assets:  Communication Skills Desire for Improvement Housing Physical Health Resilience Social Support  ADL's:  Intact  Cognition: WNL  Sleep:  Fair   Screenings: PHQ2-9     Office Visit from [facility]/06/2016 in [facility]  PHQ-2 Total Score  0       Assessment and Plan: Major depressive disorder, recurrent.  Panic attacks.  Discuss his medication.  Recommended to try Wellbutrin XL 300 mg daily to help his residual depression.  He is still have Klonopin leftover and he has not taken it and does not need a new prescription.  He is not interested in counseling.  Recommended to have sleep study to rule out sleep apnea.   Recommended to call us back if is any question or any concern.  Follow-up in 3 months.   Cleotis NipperSyed T Arfeen, MD 03/28/2017, 8:09 AM

## 2017-04-03 DIAGNOSIS — J358 Other chronic diseases of tonsils and adenoids: Secondary | ICD-10-CM | POA: Diagnosis not present

## 2017-05-01 DIAGNOSIS — K1379 Other lesions of oral mucosa: Secondary | ICD-10-CM | POA: Insufficient documentation

## 2017-05-01 DIAGNOSIS — R1319 Other dysphagia: Secondary | ICD-10-CM | POA: Insufficient documentation

## 2017-05-06 ENCOUNTER — Other Ambulatory Visit: Payer: Self-pay

## 2017-05-06 MED ORDER — OMEPRAZOLE 40 MG PO CPDR: DELAYED_RELEASE_CAPSULE | ORAL | 0 refills | Status: DC

## 2017-05-27 ENCOUNTER — Other Ambulatory Visit (HOSPITAL_COMMUNITY): Payer: Self-pay

## 2017-05-27 DIAGNOSIS — F331 Major depressive disorder, recurrent, moderate: Secondary | ICD-10-CM

## 2017-05-27 MED ORDER — BUPROPION HCL ER (XL) 300 MG PO TB24: 300.0000 mg | ORAL_TABLET | Freq: Every day | ORAL | 0 refills | Status: DC

## 2017-05-27 NOTE — Progress Notes (Signed)
Sent in 30 day refill per Regan. Appointment is 06-26-17

## 2017-05-28 ENCOUNTER — Other Ambulatory Visit (HOSPITAL_COMMUNITY): Payer: Self-pay | Admitting: Psychiatry

## 2017-05-28 DIAGNOSIS — F331 Major depressive disorder, recurrent, moderate: Secondary | ICD-10-CM

## 2017-06-26 ENCOUNTER — Ambulatory Visit (HOSPITAL_COMMUNITY): Payer: BLUE CROSS/BLUE SHIELD | Admitting: Psychiatry

## 2017-06-26 ENCOUNTER — Encounter (HOSPITAL_COMMUNITY): Payer: Self-pay | Admitting: Psychiatry

## 2017-06-26 DIAGNOSIS — F410 Panic disorder [episodic paroxysmal anxiety] without agoraphobia: Secondary | ICD-10-CM | POA: Diagnosis not present

## 2017-06-26 DIAGNOSIS — Z818 Family history of other mental and behavioral disorders: Secondary | ICD-10-CM | POA: Diagnosis not present

## 2017-06-26 DIAGNOSIS — F331 Major depressive disorder, recurrent, moderate: Secondary | ICD-10-CM

## 2017-06-26 MED ORDER — BUPROPION HCL ER (XL) 300 MG PO TB24: 300.0000 mg | ORAL_TABLET | Freq: Every day | ORAL | 0 refills | Status: DC

## 2017-06-26 NOTE — Progress Notes (Signed)
BH MD/PA/NP OP Progress Note  06/26/2017 8:25 AM Douglas Landry  MRN:  [id number]  Chief Complaint: I like to increase dose of medication.  HPI: Patient came for his follow-up appointment.  On his last visit we increase Wellbutrin XL 300 mg.  He is doing much better.  He has increased energy and he is feeling less anxious and less depressed.  His sleep is good.  He denies any major panic attack and he has not taken Klonopin since the last visit.  Recently he seen ENT specialist because difficulty swallowing but no new medication given.  He is taking Prilosec for GERD.  Patient noticed increased energy level with increase Wellbutrin.  He denies any feeling of hopelessness or worthlessness.  His appetite is okay.  He lost 10 pounds since the last visit as he is trying to lose weight.  He has no tremors, shakes or any EPS.  He has no paranoia or any hallucination.  Patient denies drinking alcohol or using any illegal substances.  Visit Diagnosis:    ICD-10-CM   1. MDD (major depressive disorder), recurrent episode, moderate (HCC) F33.1 buPROPion (WELLBUTRIN XL) 300 MG 24 hr tablet    Past Psychiatric History: Viewed Patient had a history of depression since 2010 when he attended college in [place]. He was prescribed Zoloft which he took on and off. Patient denies any history of psychiatric inpatient treatment, mania, hallucination but endorsed history of poor attention and concentration. In the school he has difficulty achieving his grades.   Past Medical History:  Past Medical History:  Diagnosis Date  . Blind right eye   . Cataract   . Chicken pox   . Depression   . Frequent headaches   . GERD (gastroesophageal reflux disease)   . Migraines     Past Surgical History:  Procedure Laterality Date  . 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 FRACTURE SURGERY Left    left hand fracture reset 2017  . WISDOM TOOTH EXTRACTION      Family Psychiatric History:  Reviewed  Family History:  Family History  Problem Relation Age of Onset  . Arthritis Mother   . Hyperlipidemia Mother   . Hypertension Mother   . Depression Mother   . Heart disease Father   . Arthritis Maternal Grandfather   . Depression Brother   . Suicidality Paternal Grandfather     Social History:  Social History   Socioeconomic History  . Marital status: Married    Spouse name: Not on file  . Number of children: Not on file  . Years of education: Not on file  . Highest education level: Not on file  Occupational History  . Occupation: IT specialist  Social Needs  . Financial resource strain: Not on file  . Food insecurity:    Worry: Not on file    Inability: Not on file  . Transportation needs:    Medical: Not on file    Non-medical: Not on file  Tobacco Use  . Smoking status: Never Smoker  . Smokeless tobacco: Never Used  Substance and Sexual Activity  . Alcohol use: No  . Drug use: No  . Sexual activity: Yes    Partners: Female  Lifestyle  . Physical activity:    Days per week: Not on file    Minutes per session: Not on file  . Stress: Not on file  Relationships  . Social connections:    Talks on phone: Not on  file    Gets together: Not on file    Attends religious service: Not on file    Active member of club or organization: Not on file    Attends meetings of clubs or organizations: Not on file    Relationship status: Not on file  Other Topics Concern  . Not on file  Social History Narrative  . Not on file    Allergies: Not on File  Metabolic Disorder Labs: No results found for: HGBA1C, MPG No results found for: PROLACTIN Lab Results  Component Value Date   CHOL 221 (H) 07/16/2016   TRIG 138.0 07/16/2016   HDL 43.20 07/16/2016   CHOLHDL 5 07/16/2016   VLDL 27.6 07/16/2016   LDLCALC 150 (H) 07/16/2016   Lab Results  Component Value Date   TSH 2.01 07/16/2016    Therapeutic Level Labs: No results found for: LITHIUM No results  found for: VALPROATE No components found for:  CBMZ  Current Medications: Current Outpatient Medications  Medication Sig Dispense Refill  . buPROPion (WELLBUTRIN XL) 300 MG 24 hr tablet Take 1 tablet (300 mg total) by mouth daily. 30 tablet 0  . omeprazole (PRILOSEC) 40 MG capsule TAKE 1 CAPSULE BY MOUTH EVERY DAY 90 capsule 0  . clonazePAM (KLONOPIN) 0.5 MG tablet Take 1 tablet (0.5 mg total) by mouth 2 (two) times daily as needed for anxiety. (Patient not taking: Reported on 06/26/2017) 14 tablet 0  . oxybutynin (DITROPAN) 5 MG tablet Take 1 tablet (5 mg total) 2 (two) times daily by mouth. (Patient not taking: Reported on 06/26/2017) 180 tablet 0   No current facility-administered medications for this visit.      Musculoskeletal: Strength & Muscle Tone: within normal limits Gait & Station: normal Patient leans: N/A  Psychiatric Specialty Exam: ROS  Blood pressure 122/86, pulse 92, height 6' (1.829 m), weight 201 lb (91.2 kg), SpO2 97 %.Body mass index is 27.26 kg/m.  General Appearance: Casual  Eye Contact:  Good  Speech:  Clear and Coherent  Volume:  Normal  Mood:  Euthymic  Affect:  Appropriate  Thought Process:  Goal Directed  Orientation:  Full (Time, Place, and Person)  Thought Content: Logical   Suicidal Thoughts:  No  Homicidal Thoughts:  No  Memory:  Immediate;   Good Recent;   Good Remote;   Good  Judgement:  Good  Insight:  Good  Psychomotor Activity:  Normal  Concentration:  Concentration: Good and Attention Span: Good  Recall:  Good  Fund of Knowledge: Good  Language: Good  Akathisia:  No  Handed:  Right  AIMS (if indicated): not done  Assets:  Communication Skills Desire for Improvement Housing Resilience Social Support  ADL's:  Intact  Cognition: WNL  Sleep:  Good   Screenings: PHQ2-9     Office Visit from 06/06/2016 in [facility]  PHQ-2 Total Score  0       Assessment and Plan: Major depressive  disorder, recurrent.  Panic attacks.  Patient doing better on Wellbutrin XL 300 mg daily.  He has not taken Klonopin and he does not need a new prescription at this time.  Patient is not interested in counseling.  I reviewed records from ENT.  No new medication given by ENT specialist.  I recommended to call us back if is any question or any concern.  Follow-up in 3 months.   Cleotis NipperSyed T Terris Germano, MD 06/26/2017, 8:25 AM

## 2017-08-07 ENCOUNTER — Encounter: Payer: Self-pay | Admitting: Medical

## 2017-08-07 ENCOUNTER — Ambulatory Visit: Payer: BLUE CROSS/BLUE SHIELD | Admitting: Medical

## 2017-08-07 VITALS — BP 117/88 | HR 96 | Temp 98.2°F | Resp 16 | Ht 73.0 in | Wt 201.0 lb

## 2017-08-07 DIAGNOSIS — R1310 Dysphagia, unspecified: Secondary | ICD-10-CM

## 2017-08-07 DIAGNOSIS — N451 Epididymitis: Secondary | ICD-10-CM

## 2017-08-07 DIAGNOSIS — I861 Scrotal varices: Secondary | ICD-10-CM

## 2017-08-07 DIAGNOSIS — F32A Depression, unspecified: Secondary | ICD-10-CM

## 2017-08-07 DIAGNOSIS — F329 Major depressive disorder, single episode, unspecified: Secondary | ICD-10-CM

## 2017-08-07 DIAGNOSIS — K219 Gastro-esophageal reflux disease without esophagitis: Secondary | ICD-10-CM

## 2017-08-07 MED ORDER — CIPROFLOXACIN HCL 500 MG PO TABS: 500.0000 mg | ORAL_TABLET | Freq: Two times a day (BID) | ORAL | 0 refills | Status: DC

## 2017-08-07 NOTE — Patient Instructions (Signed)
For your history of reflux and for your intermittent episodes of difficulty swallowing, I did place referral to gastroenterologist.  Continue your omeprazole.  For your history of depression, continue to follow-up with your psychiatrist.  Douglas Landry to hear that you are doing well with your mood.  You do have some symptoms of possible epididymitis.  I am prescribing a Cipro antibiotic.  Since her symptoms have been intermittent and persistent for a couple of years now, I am going to refer you to urologist for evaluation.  They can give opinion if your varicocele is causing some your symptoms.  Also they can evaluate your mid urethral lump sensation that you feel.  Follow-up in 3 to 4 weeks or as needed.  If by 10 days you do not get a call from either specialist then asked that you call here for an update on those referrals.  Or you could my chart me as well.

## 2017-08-07 NOTE — Progress Notes (Signed)
   Subjective:    Patient ID: Douglas Landry, male    DOB: 10/19/1970, 47 y.o.   MRN: [id number]  HPI  Pt in for follow up.  He states most of time his reflux is well controlled but then he describes some times he feels like he has difficulty swallowing. Pt describes over past years he will get incident about once a month where he feels it is difficult to pass foods. Has to drink to wash foods down. Notices with various foods. Hx of some reflux symptoms for about 5-6 years. But on ppi for about one year.  Also one event of uveitis. Was quite swollen and then went to UC about 2 months ago. They referred him to ENT. No problem that persists. No recurrent uvula swelling.  Pt has been seeing psychiatrist. He is on wellbutrin. He is feeling better with his mood.  Pt stopped ditropan due to dry mouth effects. Hx of some post void dribble. But he states not much dribble.   Pt in stating his rt testicle is tender more than left side. He states epididymis still feeling tender on both sides. Hx of testicle pain for 2 years. 11-2016 he had [place]. Left side varicocele found in the past.  Also he reports feels small bumps/lumbs distal part of his urethra. He reports some splitting urine flow in the past. Denies any remote history of urethral infection. No penis surgery except circumcision.    Review of Systems See hpi.    Objective:   Physical Exam  General- No acute distress. Pleasant patient. Neck- Full range of motion, no jvd Lungs- Clear, even and unlabored. Heart- regular rate and rhythm. Neurologic- CNII- XII grossly intact. Genital- mid penis behind glands ventral surface may feel small lump. Testicle mild tender in epididymis area. Abdomen- soft, nontender, nondistended, +bs. No rebound or guarding.       Assessment & Plan:  For your history of reflux and for your intermittent episodes of difficulty swallowing, I did place referral to gastroenterologist.  Continue your omeprazole.  For  your history of depression, continue to follow-up with your psychiatrist.  Titus Dubin to hear that you are doing well with your mood.  You do have some symptoms of possible epididymitis.  I am prescribing a Cipro antibiotic.  Since her symptoms have been intermittent and persistent for a couple of years now, I am going to refer you to urologist for evaluation.  They can give opinion if your varicocele is causing some your symptoms.  Also they can evaluate your mid urethral lump sensation that you feel.  Follow-up in 3 to 4 weeks or as needed.  Not if any change in testicle pain such acute/severe type pain notify me and would order [place]. But if severe and after hours then advise ED evaluation. Pt expressed understanding.  If by 10 days you do not get a call from either specialist then asked that you call here for an update on those referrals.  Or you could my chart me as well.  Esperanza Richters, PA-C

## 2017-08-14 ENCOUNTER — Ambulatory Visit: Payer: BLUE CROSS/BLUE SHIELD | Admitting: Medical

## 2017-08-27 ENCOUNTER — Other Ambulatory Visit: Payer: Self-pay | Admitting: Medical

## 2017-09-09 ENCOUNTER — Encounter: Payer: Self-pay | Admitting: Gastroenterology

## 2017-09-13 DIAGNOSIS — N50811 Right testicular pain: Secondary | ICD-10-CM | POA: Diagnosis not present

## 2017-09-13 DIAGNOSIS — N486 Induration penis plastica: Secondary | ICD-10-CM | POA: Diagnosis not present

## 2017-09-13 DIAGNOSIS — N5201 Erectile dysfunction due to arterial insufficiency: Secondary | ICD-10-CM | POA: Diagnosis not present

## 2017-09-13 DIAGNOSIS — N3943 Post-void dribbling: Secondary | ICD-10-CM | POA: Diagnosis not present

## 2017-09-26 ENCOUNTER — Ambulatory Visit (HOSPITAL_COMMUNITY): Payer: Self-pay | Admitting: Psychiatry

## 2017-09-26 ENCOUNTER — Other Ambulatory Visit (HOSPITAL_COMMUNITY): Payer: Self-pay

## 2017-09-26 DIAGNOSIS — F331 Major depressive disorder, recurrent, moderate: Secondary | ICD-10-CM

## 2017-09-26 MED ORDER — BUPROPION HCL ER (XL) 300 MG PO TB24: 300.0000 mg | ORAL_TABLET | Freq: Every day | ORAL | 0 refills | Status: DC

## 2017-10-15 ENCOUNTER — Ambulatory Visit (INDEPENDENT_AMBULATORY_CARE_PROVIDER_SITE_OTHER): Payer: BLUE CROSS/BLUE SHIELD | Admitting: Gastroenterology

## 2017-10-15 ENCOUNTER — Encounter: Payer: Self-pay | Admitting: Gastroenterology

## 2017-10-15 VITALS — BP 118/74 | HR 68 | Ht 73.0 in | Wt 205.0 lb

## 2017-10-15 DIAGNOSIS — R1310 Dysphagia, unspecified: Secondary | ICD-10-CM

## 2017-10-15 DIAGNOSIS — K219 Gastro-esophageal reflux disease without esophagitis: Secondary | ICD-10-CM | POA: Diagnosis not present

## 2017-10-15 NOTE — Progress Notes (Signed)
Chief Complaint: Dysphagia  Referring Provider:  Esperanza RichtersSaguier, Edward, PA-C      ASSESSMENT AND PLAN;   #1. Esophageal Dysphagia. Differential diagnoses includes esophageal stricture, Schatzki's ring, motility disorder, eosinophilic esophagitis, pill induced esophagitis, rule out esophageal carcinoma or extrinsic lesions. #2. GERD.  Plan: -Continue omeprazole 20mg  po qd. -Proceed with Ba swallow with barium tablet -Thereafter, proceed with a EGD with esophageal biopsies and possible esophageal dilatation.  I discussed risks and benefits with the patient in detail including small but definite risks of bleeding, esophageal perforation, risks of anesthesia. -I have instructed patient that she needs to chew foods especially meats and breads well and eat slowly. -Screening colonoscopy at the age of 47.    HPI:    Douglas Landry is a 47 y.o. male  With intermittent dysphagia mostly to solids over the last 1 to 2 years, intermittent, upper chest, mostly with meats, has to wash it down with liquids, has actually gotten better since he has been on omeprazole. Has been having long-standing heartburn over last several years, better with omeprazole 20 mg p.o. once a day. Denies having any weight loss or melena. Denies having any odynophagia, regurgitation. No significant nonsteroidal use. Has occasional constipation, better with increased water intake. Had negative ENT evaluation previously.   Past Medical History:  Diagnosis Date  . Blind right eye   . Cataract   . Chicken pox   . Depression   . Frequent headaches   . GERD (gastroesophageal reflux disease)   . Migraines     Past Surgical History:  Procedure Laterality Date  . 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 FRACTURE SURGERY Left    left hand fracture reset 2017  . WISDOM TOOTH EXTRACTION      Family History  Problem Relation Age of Onset  . Arthritis Mother   . Hyperlipidemia Mother   .  Hypertension Mother   . Depression Mother   . Heart disease Father   . Arthritis Maternal Grandfather   . Depression Brother   . Suicidality Paternal Grandfather   . Colon cancer Neg Hx     Social History   Tobacco Use  . Smoking status: Never Smoker  . Smokeless tobacco: Never Used  Substance Use Topics  . Alcohol use: No  . Drug use: No    Current Outpatient Medications  Medication Sig Dispense Refill  . buPROPion (WELLBUTRIN XL) 300 MG 24 hr tablet Take 1 tablet (300 mg total) by mouth daily. 90 tablet 0  . omeprazole (PRILOSEC) 40 MG capsule TAKE 1 CAPSULE BY MOUTH EVERY DAY 90 capsule 0   No current facility-administered medications for this visit.     Not on File  Review of Systems:  Constitutional: Denies fever, chills, diaphoresis, appetite change and has fatigue.  HEENT: Has vision changes.   Respiratory: Denies SOB, DOE, cough, chest tightness,  and wheezing.   Cardiovascular: Denies chest pain, palpitations and leg swelling.  Genitourinary: Denies dysuria, urgency, frequency, hematuria, flank pain and difficulty urinating.  Musculoskeletal: Denies myalgias, has back pain, no joint swelling, arthralgias and gait problem.  Skin: No rash.  Neurological: Denies dizziness, seizures, syncope, weakness, light-headedness, numbness and has headaches.  Hematological: Denies adenopathy. Easy bruising, personal or family bleeding history  Psychiatric/Behavioral: Has anxiety or depression     Physical Exam:    BP 118/74   Pulse 68   Ht 6\' 1"  (1.854 m)   Wt 205 lb (93 kg)  BMI 27.05 kg/m  Filed Weights   10/15/17 0837  Weight: 205 lb (93 kg)   Constitutional:  Well-developed, in no acute distress. Psychiatric: Normal mood and affect. Behavior is normal. HEENT: Pupils normal.  Conjunctivae are normal. No scleral icterus. Neck supple.  Cardiovascular: Normal rate, regular rhythm. No edema Pulmonary/chest: Effort normal and breath sounds normal. No wheezing,  rales or rhonchi. Abdominal: Soft, nondistended. Nontender. Bowel sounds active throughout. There are no masses palpable. No hepatomegaly. Rectal:  defered Neurological: Alert and oriented to person place and time. Skin: Skin is warm and dry. No rashes noted.  Data Reviewed: I have personally reviewed following labs and imaging studies  CBC: CBC Latest Ref Rng & Units 07/16/2016  WBC 4.0 - 10.5 K/uL 6.0  Hemoglobin 13.0 - 17.0 g/dL 19.1  Hematocrit 47.8 - 52.0 % 45.6  Platelets 150.0 - 400.0 K/uL 171.0    CMP: CMP Latest Ref Rng & Units 07/16/2016  Glucose 70 - 99 mg/dL 97  BUN 6 - 23 mg/dL 13  Creatinine 2.95 - 6.21 mg/dL 3.08  Sodium 657 - 846 mEq/L 138  Potassium 3.5 - 5.1 mEq/L 4.0  Chloride 96 - 112 mEq/L 103  CO2 19 - 32 mEq/L 29  Calcium 8.4 - 10.5 mg/dL 9.9  Total Protein 6.0 - 8.3 g/dL 7.5  Total Bilirubin 0.2 - 1.2 mg/dL 0.7  Alkaline Phos 39 - 117 U/L 91  AST 0 - 37 U/L 21  ALT 0 - 53 U/L 22     Edman Circle, MD 10/15/2017, 8:56 AM  Cc: Esperanza Richters, PA-C

## 2017-10-15 NOTE — Patient Instructions (Signed)
If you are age 46 or older, your body mass index should be between 23-30. Your Body mass index is 27.05 kg/m. If this is out of the aforementioned range listed, please consider follow up with your Primary Care Provider.  If you are age 46 or younger, your body mass index should be between 19-25. Your Body mass index is 27.05 kg/m. If this is out of the aformentioned range listed, please consider follow up with your Primary Care Provider.     You have been scheduled for a Barium Esophogram at [facility] (1st floor of the hospital) on 10/23/17 at 9:30am. Please arrive 15 minutes prior to your appointment for registration. Make certain not to have anything to eat or drink 6 hours prior to your test. If you need to reschedule for any reason, please contact radiology at [phone] to do so. __________________________________________________________________ A barium swallow is an examination that concentrates on views of the esophagus. This tends to be a double contrast exam (barium and two liquids which, when combined, create a gas to distend the wall of the oesophagus) or single contrast (non-ionic iodine based). The study is usually tailored to your symptoms so a good history is essential. Attention is paid during the study to the form, structure and configuration of the esophagus, looking for functional disorders (such as aspiration, dysphagia, achalasia, motility and reflux) EXAMINATION You may be asked to change into a gown, depending on the type of swallow being performed. A radiologist and radiographer will perform the procedure. The radiologist will advise you of the type of contrast selected for your procedure and direct you during the exam. You will be asked to stand, sit or lie in several different positions and to hold a small amount of fluid in your mouth before being asked to swallow while the imaging is performed .In some instances you may be asked to swallow barium coated  marshmallows to assess the motility of a solid food bolus. The exam can be recorded as a digital or video fluoroscopy procedure. POST PROCEDURE It will take 1-2 days for the barium to pass through your system. To facilitate this, it is important, unless otherwise directed, to increase your fluids for the next 24-48hrs and to resume your normal diet.  This test typically takes about 30 minutes to perform. _______________________________________________________________________________   Bonita QuinYou have been scheduled for an endoscopy. Please follow written instructions given to you at your visit today. If you use inhalers (even only as needed), please bring them with you on the day of your procedure. Your physician has requested that you go to www.startemmi.com and enter the access code given to you at your visit today. This web site gives a general overview about your procedure. However, you should still follow specific instructions given to you by our office regarding your preparation for the procedure.  Thank you,  Dr. Lynann Bolognaajesh Gupta

## 2017-10-23 ENCOUNTER — Ambulatory Visit (HOSPITAL_COMMUNITY): Payer: Self-pay | Admitting: Psychiatry

## 2017-10-23 ENCOUNTER — Ambulatory Visit (HOSPITAL_COMMUNITY)
Admission: RE | Admit: 2017-10-23 | Discharge: 2017-10-23 | Disposition: A | Discharge status: Home / Self Care | Payer: BLUE CROSS/BLUE SHIELD | Source: Ambulatory Visit | Attending: Gastroenterology | Admitting: Gastroenterology

## 2017-10-23 DIAGNOSIS — K219 Gastro-esophageal reflux disease without esophagitis: Secondary | ICD-10-CM | POA: Diagnosis not present

## 2017-10-23 DIAGNOSIS — K449 Diaphragmatic hernia without obstruction or gangrene: Secondary | ICD-10-CM | POA: Diagnosis not present

## 2017-10-23 DIAGNOSIS — K224 Dyskinesia of esophagus: Secondary | ICD-10-CM | POA: Insufficient documentation

## 2017-10-23 DIAGNOSIS — R1310 Dysphagia, unspecified: Secondary | ICD-10-CM | POA: Insufficient documentation

## 2017-11-07 ENCOUNTER — Encounter: Payer: Self-pay | Admitting: Gastroenterology

## 2017-11-21 ENCOUNTER — Encounter: Payer: Self-pay | Admitting: Gastroenterology

## 2017-11-21 ENCOUNTER — Ambulatory Visit (AMBULATORY_SURGERY_CENTER): Payer: BLUE CROSS/BLUE SHIELD | Admitting: Gastroenterology

## 2017-11-21 VITALS — BP 118/82 | HR 83 | Temp 98.0°F | Resp 18 | Ht 73.0 in | Wt 205.0 lb

## 2017-11-21 DIAGNOSIS — K219 Gastro-esophageal reflux disease without esophagitis: Secondary | ICD-10-CM

## 2017-11-21 DIAGNOSIS — K2950 Unspecified chronic gastritis without bleeding: Secondary | ICD-10-CM | POA: Diagnosis not present

## 2017-11-21 DIAGNOSIS — R1310 Dysphagia, unspecified: Secondary | ICD-10-CM | POA: Diagnosis not present

## 2017-11-21 DIAGNOSIS — R1319 Other dysphagia: Secondary | ICD-10-CM

## 2017-11-21 MED ORDER — OMEPRAZOLE 20 MG PO CPDR: 20.0000 mg | DELAYED_RELEASE_CAPSULE | Freq: Every day | ORAL | 11 refills | Status: DC

## 2017-11-21 MED ORDER — SODIUM CHLORIDE 0.9 % IV SOLN: 500.0000 mL | Freq: Once | INTRAVENOUS | Status: DC

## 2017-11-21 NOTE — Progress Notes (Signed)
Pt is blind in right eye.  maw

## 2017-11-21 NOTE — Patient Instructions (Signed)
Please read handouts provided. Begin Omeprazole 20 mg by mouth every day. Follow Dilation Diet.    YOU HAD AN ENDOSCOPIC PROCEDURE TODAY AT THE [facility]:   Refer to the procedure report that was given to you for any specific questions about what was found during the examination.  If the procedure report does not answer your questions, please call your gastroenterologist to clarify.  If you requested that your care partner not be given the details of your procedure findings, then the procedure report has been included in a sealed envelope for you to review at your convenience later.  YOU SHOULD EXPECT: Some feelings of bloating in the abdomen. Passage of more gas than usual.  Walking can help get rid of the air that was put into your GI tract during the procedure and reduce the bloating. If you had a lower endoscopy (such as a colonoscopy or flexible sigmoidoscopy) you may notice spotting of blood in your stool or on the toilet paper. If you underwent a bowel prep for your procedure, you may not have a normal bowel movement for a few days.  Please Note:  You might notice some irritation and congestion in your nose or some drainage.  This is from the oxygen used during your procedure.  There is no need for concern and it should clear up in a day or so.  SYMPTOMS TO REPORT IMMEDIATELY:     Following upper endoscopy (EGD)  Vomiting of blood or coffee ground material  New chest pain or pain under the shoulder blades  Painful or persistently difficult swallowing  New shortness of breath  Fever of 100F or higher  Black, tarry-looking stools  For urgent or emergent issues, a gastroenterologist can be reached at any hour by calling [phone].   DIET:   Drink plenty of fluids but you should avoid alcoholic beverages for 24 hours.  ACTIVITY:  You should plan to take it easy for the rest of today and you should NOT DRIVE or use heavy machinery until tomorrow (because of the  sedation medicines used during the test).    FOLLOW UP: Our staff will call the number listed on your records the next business day following your procedure to check on you and address any questions or concerns that you may have regarding the information given to you following your procedure. If we do not reach you, we will leave a message.  However, if you are feeling well and you are not experiencing any problems, there is no need to return our call.  We will assume that you have returned to your regular daily activities without incident.  If any biopsies were taken you will be contacted by phone or by letter within the next 1-3 weeks.  Please call us at [phone] if you have not heard about the biopsies in 3 weeks.    SIGNATURES/CONFIDENTIALITY: You and/or your care partner have signed paperwork which will be entered into your electronic medical record.  These signatures attest to the fact that that the information above on your After Visit Summary has been reviewed and is understood.  Full responsibility of the confidentiality of this discharge information lies with you and/or your care-partner.

## 2017-11-21 NOTE — Op Note (Signed)
[facility] Patient Name: Douglas Landry Procedure Date: 11/21/2017 9:07 AM MRN: [id number] Endoscopist: Lynann Bologna , MD Age: 47 Referring MD:  Date of Birth: 27-Nov-1970 Gender: Male Account #: [id number] Procedure:                Upper GI endoscopy Indications:              Dysphagia with Barium swallow showing mildly                            tortuous esophagus. Medicines:                Monitored Anesthesia Care Procedure:                Pre-Anesthesia Assessment:                           - Prior to the procedure, a History and Physical                            was performed, and patient medications and                            allergies were reviewed. The patient's tolerance of                            previous anesthesia was also reviewed. The risks                            and benefits of the procedure and the sedation                            options and risks were discussed with the patient.                            All questions were answered, and informed consent                            was obtained. Prior Anticoagulants: The patient has                            taken no previous anticoagulant or antiplatelet                            agents. ASA Grade Assessment: II - A patient with                            mild systemic disease. After reviewing the risks                            and benefits, the patient was deemed in                            satisfactory condition to undergo the procedure.  After obtaining informed consent, the endoscope was                            passed under direct vision. Throughout the                            procedure, the patient's blood pressure, pulse, and                            oxygen saturations were monitored continuously. The                            Endoscope was introduced through the mouth, and                            advanced to the second part of duodenum. The  upper                            GI endoscopy was accomplished without difficulty.                            The patient tolerated the procedure well. Scope In: Scope Out: Findings:                 The examined esophagus was mildly tortuous.                            Biopsies were obtained from the proximal and distal                            esophagus with cold forceps for histology to r/o                            eosinophilic esophagitis. The scope was withdrawn.                            Dilation was performed with a Maloney dilator with                            mild resistance at 50 Fr. Estimated blood loss:                            none.                           A 2 cm hiatal hernia was present. Mild antral                            gastritis. Biopsies were taken with a cold forceps                            for histology to r/o HP. Estimated blood loss: none.  The exam was otherwise without abnormality. Complications:            No immediate complications. Estimated Blood Loss:     Estimated blood loss: none. Impression:               - Mildly tortuous esophagus (s/p esophageal                            biopsies and empiric esophageal dilatation).                           - 2 cm hiatal hernia. Biopsied.                           - The examination was otherwise normal. Recommendation:           - Patient has a contact number available for                            emergencies. The signs and symptoms of potential                            delayed complications were discussed with the                            patient. Return to normal activities tomorrow.                            Written discharge instructions were provided to the                            patient.                           - Postdilatation diet.                           - Continue present medications.                           - Chew foods especially meats and breads  well and                            eats slowly.                           - Await pathology results.                           - Return to GI clinic PRN. If still with problems,                            would consider esophageal manometry. Lynann Bologna, MD 11/21/2017 9:39:05 AM This report has been signed electronically.

## 2017-11-21 NOTE — Progress Notes (Signed)
Report to PACU, RN, vss, BBS= Clear.  

## 2017-11-21 NOTE — Progress Notes (Signed)
Called to room to assist during endoscopic procedure.  Patient ID and intended procedure confirmed with present staff. Received instructions for my participation in the procedure from the performing physician.  

## 2017-11-22 ENCOUNTER — Telehealth: Payer: Self-pay | Admitting: *Deleted

## 2017-11-22 ENCOUNTER — Telehealth: Payer: Self-pay

## 2017-11-22 NOTE — Telephone Encounter (Signed)
Left message on f/u call 

## 2017-11-22 NOTE — Telephone Encounter (Signed)
  Follow up Call-  Call back number 11/21/2017  Post procedure Call Back phone  # #[phone] cell  Permission to leave phone message Yes     Patient questions:  Do you have a fever, pain , or abdominal swelling? No. Pain Score  0 *  Have you tolerated food without any problems? Yes.    Have you been able to return to your normal activities? Yes.    Do you have any questions about your discharge instructions: Diet   No. Medications  No. Follow up visit  No.  Do you have questions or concerns about your Care? No.  Actions: * If pain score is 4 or above: No action needed, pain <4.

## 2017-11-25 ENCOUNTER — Encounter: Payer: Self-pay | Admitting: Gastroenterology

## 2017-12-21 ENCOUNTER — Other Ambulatory Visit (HOSPITAL_COMMUNITY): Payer: Self-pay | Admitting: Psychiatry

## 2017-12-21 DIAGNOSIS — F331 Major depressive disorder, recurrent, moderate: Secondary | ICD-10-CM

## 2017-12-27 ENCOUNTER — Ambulatory Visit (HOSPITAL_COMMUNITY): Payer: Self-pay | Admitting: Psychiatry

## 2018-01-13 ENCOUNTER — Telehealth (HOSPITAL_COMMUNITY): Payer: Self-pay

## 2018-01-13 DIAGNOSIS — F331 Major depressive disorder, recurrent, moderate: Secondary | ICD-10-CM

## 2018-01-13 MED ORDER — BUPROPION HCL ER (XL) 300 MG PO TB24: 300.0000 mg | ORAL_TABLET | Freq: Every day | ORAL | 0 refills | Status: AC

## 2018-01-13 NOTE — Telephone Encounter (Signed)
Patient called and said that his insurance requires a 90 day supply of Bupropion XL 300mg . Next appointment is 01-23-18.

## 2018-01-13 NOTE — Telephone Encounter (Signed)
Refill done at [facility]

## 2018-01-13 NOTE — Telephone Encounter (Signed)
Called left voicemail message.

## 2018-01-23 ENCOUNTER — Ambulatory Visit (HOSPITAL_COMMUNITY): Payer: BLUE CROSS/BLUE SHIELD | Admitting: Psychiatry

## 2018-01-27 DIAGNOSIS — F3342 Major depressive disorder, recurrent, in full remission: Secondary | ICD-10-CM | POA: Insufficient documentation

## 2018-01-27 DIAGNOSIS — G43009 Migraine without aura, not intractable, without status migrainosus: Secondary | ICD-10-CM | POA: Diagnosis not present

## 2018-01-27 DIAGNOSIS — Z23 Encounter for immunization: Secondary | ICD-10-CM | POA: Diagnosis not present

## 2018-02-27 ENCOUNTER — Ambulatory Visit (HOSPITAL_COMMUNITY): Payer: BLUE CROSS/BLUE SHIELD | Admitting: Psychiatry

## 2018-04-21 DIAGNOSIS — F3342 Major depressive disorder, recurrent, in full remission: Secondary | ICD-10-CM | POA: Diagnosis not present

## 2018-04-21 DIAGNOSIS — G43009 Migraine without aura, not intractable, without status migrainosus: Secondary | ICD-10-CM | POA: Diagnosis not present

## 2018-06-06 ENCOUNTER — Telehealth: Payer: Self-pay | Admitting: *Deleted

## 2018-06-06 NOTE — Telephone Encounter (Signed)
I called and spoke with pt to let him know 06/09/18 appt cx d/t concerns over covid-19. We will f/u Monday about getting him r/s. He verbalized understanding.

## 2018-06-09 ENCOUNTER — Ambulatory Visit: Payer: Self-pay | Admitting: Neurology

## 2018-06-09 ENCOUNTER — Ambulatory Visit: Payer: BLUE CROSS/BLUE SHIELD | Admitting: Neurology

## 2018-06-10 ENCOUNTER — Telehealth (INDEPENDENT_AMBULATORY_CARE_PROVIDER_SITE_OTHER): Payer: Managed Care, Other (non HMO) | Admitting: Neurology

## 2018-06-10 ENCOUNTER — Other Ambulatory Visit: Payer: Self-pay

## 2018-06-10 ENCOUNTER — Telehealth: Payer: Self-pay | Admitting: *Deleted

## 2018-06-10 ENCOUNTER — Encounter: Payer: Self-pay | Admitting: Neurology

## 2018-06-10 DIAGNOSIS — G43109 Migraine with aura, not intractable, without status migrainosus: Secondary | ICD-10-CM

## 2018-06-10 DIAGNOSIS — IMO0002 Reserved for concepts with insufficient information to code with codable children: Secondary | ICD-10-CM | POA: Insufficient documentation

## 2018-06-10 DIAGNOSIS — G43009 Migraine without aura, not intractable, without status migrainosus: Secondary | ICD-10-CM | POA: Insufficient documentation

## 2018-06-10 MED ORDER — DIVALPROEX SODIUM ER 500 MG PO TB24: 500.0000 mg | ORAL_TABLET | Freq: Every day | ORAL | 11 refills | Status: DC

## 2018-06-10 MED ORDER — SUMATRIPTAN SUCCINATE 100 MG PO TABS: 100.0000 mg | ORAL_TABLET | Freq: Once | ORAL | 6 refills | Status: DC | PRN

## 2018-06-10 NOTE — Progress Notes (Signed)
Virtual Visit via Video   I connected with Douglas Landry on 06/10/18 at  by Video and verified that I am speaking with the correct person using two identifiers.   I discussed the limitations, risks, security and privacy concerns of performing an evaluation and management service by Video and the availability of in person appointments. I also discussed with the patient that there may be a patient responsible charge related to this service. The patient expressed understanding and agreed to proceed.   History of Present Illness: Douglas Landry is a 48 year old male referred by his primary care physician Dr.Eksir, Lelon Mast A, for evaluation of chronic migraine headaches  I have reviewed and summarized referring note, he had a past medical history of anxiety, taking Wellbutrin, acid reflux, taking Prilosec, he had a history of congenital right eye blindness,  He reported history of migraine headaches since childhood, become more frequent since college, especially since 2016, he complains of increased frequency and severity of headaches, his typical migraine sometimes preceded by visual auras, light strikes in his visual field, lasting for few minutes followed by lateralized severe pounding headache with associated light noise sensitivity, mild nausea, lasting for 6 hours  He has been taking Imitrex 50 mg as needed, which helped him 50% of time, 25% of time he has to take second dose, 25% time it does not help him at all, especially the severe migraine headaches that woke him up from sleep.  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 2. Slight  retention of contrast in the piriform sinuses, which does clear with repeat swallow. 3. Sliding hiatal hernia with patulous gastroesophageal junction. 4. No discrete esophagitis or stricture.  He was treated with amitriptyline 125 mg since January 2020, which has helped his migraine son, but he complains of side effect of significant dry mouth   Observations/Objective: I have reviewed problem lists, medications, allergies.  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 The patient was provided an opportunity to ask questions and all were answered. The patient agreed with the plan and demonstrated an understanding of the instructions.   The patient was advised to call back or seek an in-person evaluation if the symptoms worsen or if the condition fails to improve as anticipated.  I provided 30 minutes minutes of non-face-to-face time during this encounter.   Levert Feinstein, MD

## 2018-06-10 NOTE — Telephone Encounter (Signed)
I spoke to the patient on 06/09/2018.  He requested a virtual visit sometime between the hours of 10-12 on 06/10/2018.  I called him this morning and left him a message to call us back.  We need to discuss with him the best time to set up the visit with Dr. Terrace Arabia.

## 2018-06-10 NOTE — Telephone Encounter (Signed)
Patient called back and scheduled a virtual meeting with Dr. Terrace Arabia at 10:30am.

## 2018-06-19 ENCOUNTER — Telehealth: Payer: Self-pay | Admitting: Neurology

## 2018-06-19 NOTE — Telephone Encounter (Addendum)
I called this patient. He just completed an e-visit on 06/10/2018. He started Depakote ER 500mg  at bedtime that same day. Since being on the medication, he has noticed visual/auditory hallucinations, tremors and memory decline. He is concerned about this being adverse medication side effects.  ----- Message -----  From: Emelia Salisbury  Sent: 06/19/2018 10:55 AM EDT  To: [facility]  Subject: Visit Follow-Up Question               Hi Dr Terrace Arabia    I have been experiencing the attached symptoms that seem to be getting progressively worse    Please let me know if these are common and will pass soon, if I need a change in my medicine, or perhaps schedule a e-cosultation.    Thanks  Carollee Herter    I have call him, imitrex high dose 100mg  as needed was helpful.  He complains of dry mouth with Elavil,100mg  qhs, did not help his headache, would like to taper it off, 50mg  qhs xone week, then stop  Depakote cause hallucination.aleady stopped   Start topamax 100mg  qhs as migraine prevention

## 2018-06-20 MED ORDER — TOPIRAMATE 100 MG PO TABS: 100.0000 mg | ORAL_TABLET | Freq: Every day | ORAL | 11 refills | Status: DC

## 2018-06-20 NOTE — Addendum Note (Signed)
Addended by: Levert Feinstein on: 06/20/2018 01:02 PM   Modules accepted: Orders

## 2018-08-14 ENCOUNTER — Ambulatory Visit: Payer: BLUE CROSS/BLUE SHIELD | Admitting: Neurology

## 2018-09-22 IMAGING — US US SCROTUM W/ DOPPLER COMPLETE
1 series · 13 of 25 positions shown · non-contrast
Comparison: None

CLINICAL DATA: LEFT testicular pain and tenderness intermittently
for years, RIGHT testicular pain a few days last week

EXAM:
SCROTAL ULTRASOUND
DOPPLER ULTRASOUND OF THE TESTICLES
TECHNIQUE: Complete ultrasound examination of the testicles, epididymis, and
other scrotal structures was performed. Color and spectral Doppler
ultrasound were also utilized to evaluate blood flow to the
testicles.

[Series 1: us scrotum w/ doppler complete · 0.07mm/px · 13 of 73 slices shown]
[im 1/73]
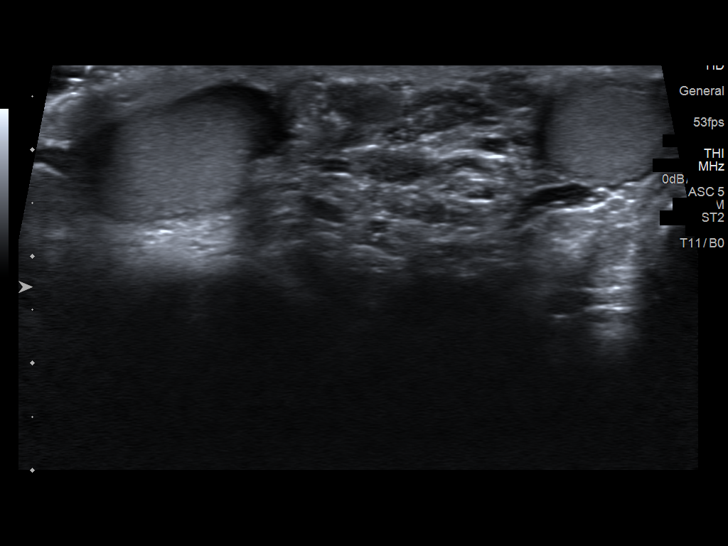
[im 7/73]
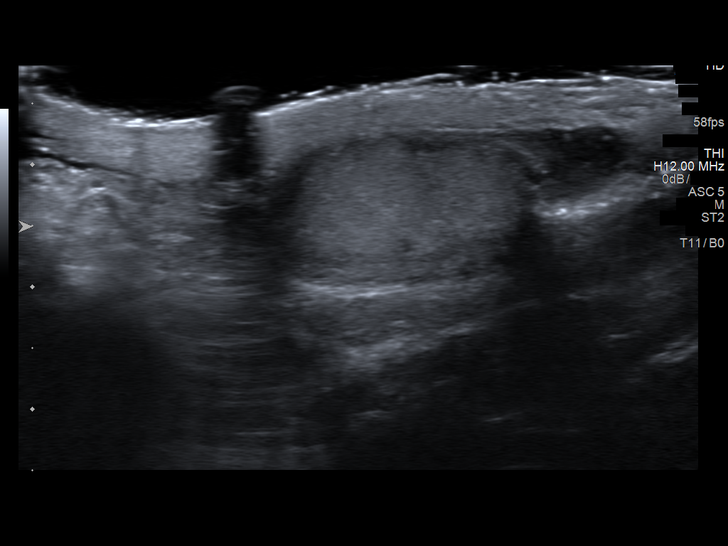
[im 13/73]
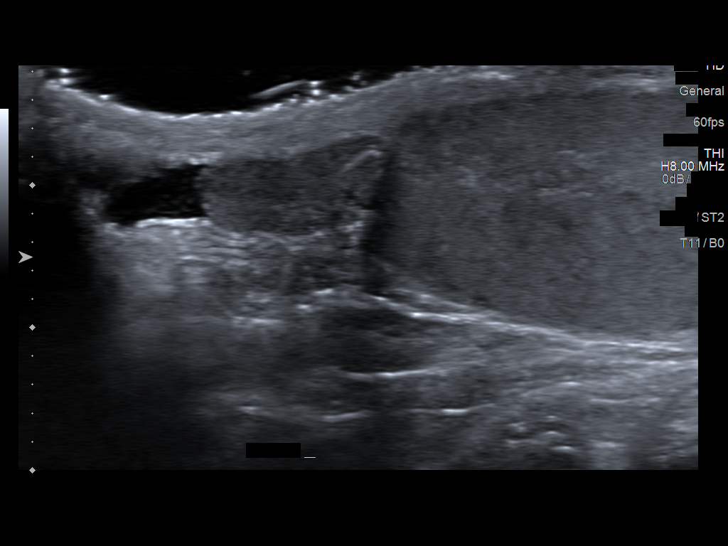
[im 19/73]
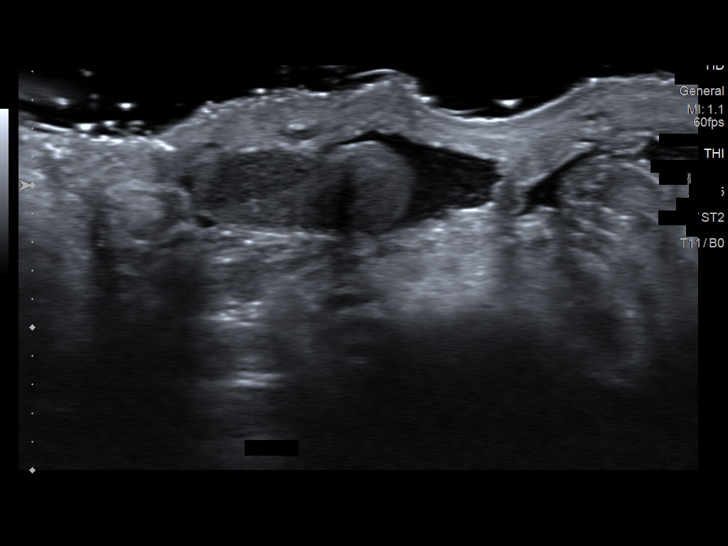
[im 25/73]
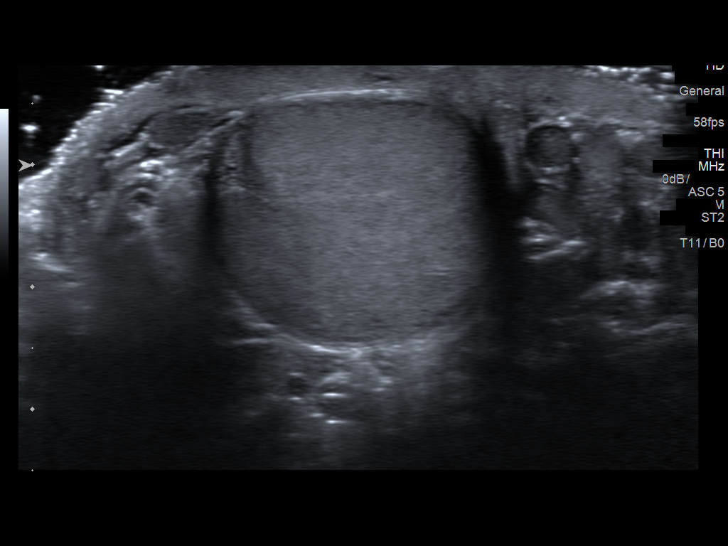
[im 31/73]
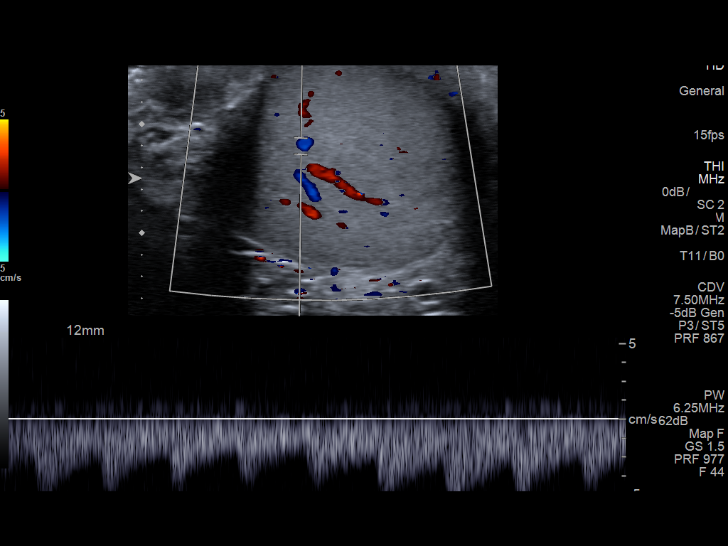
[im 37/73]
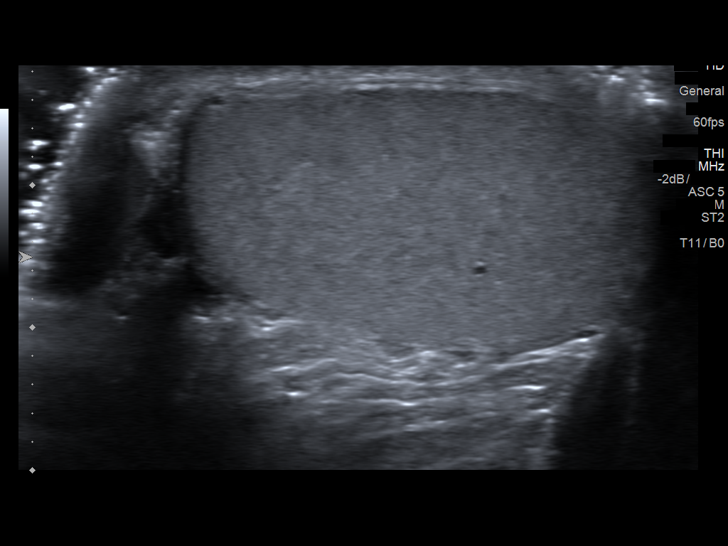
[im 43/73]
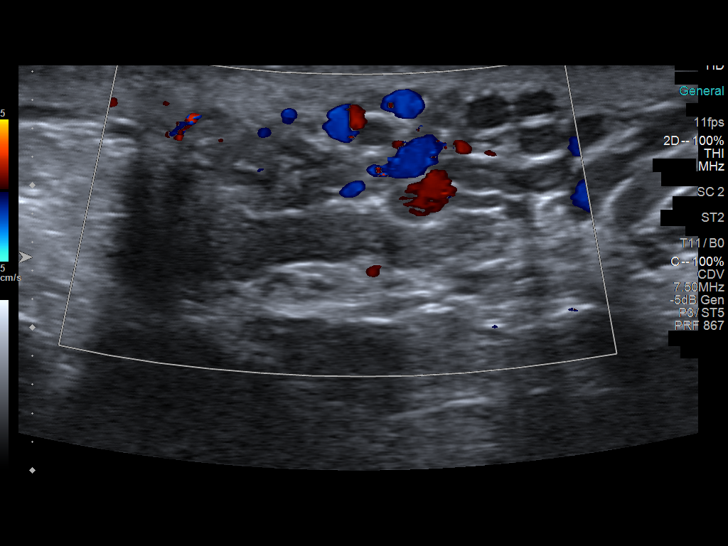
[im 49/73]
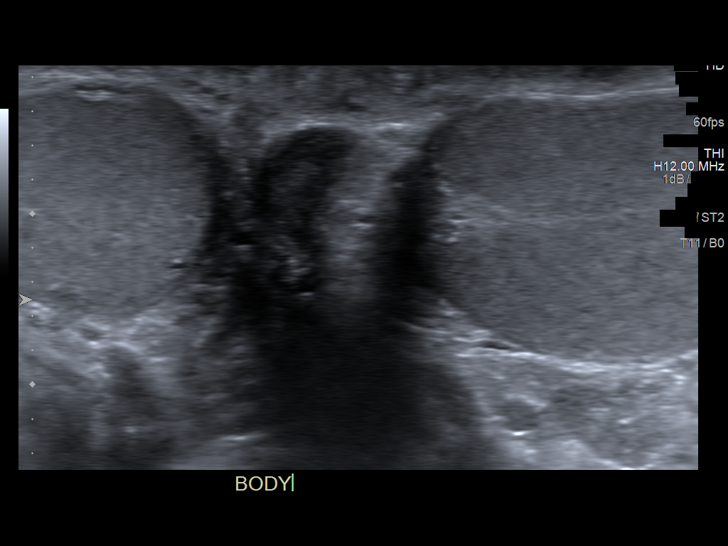
[im 55/73]
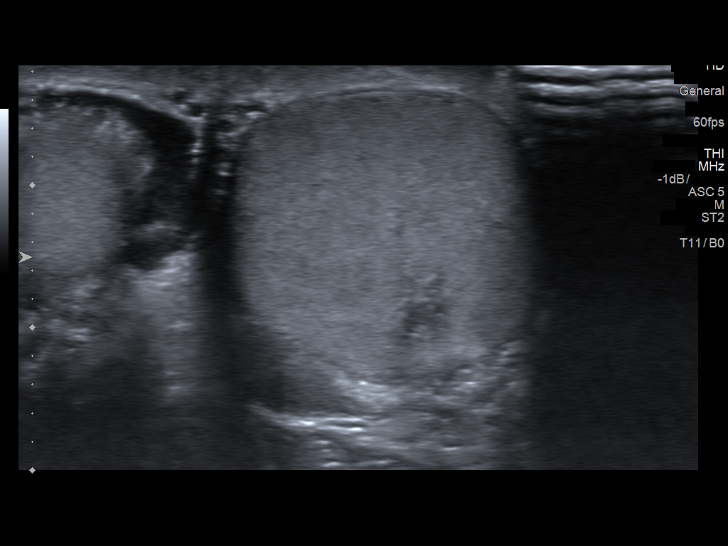
[im 61/73]
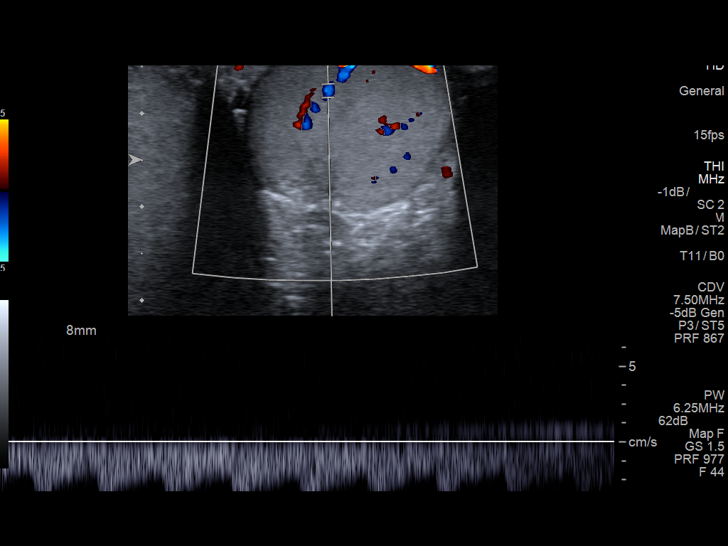
[im 67/73]
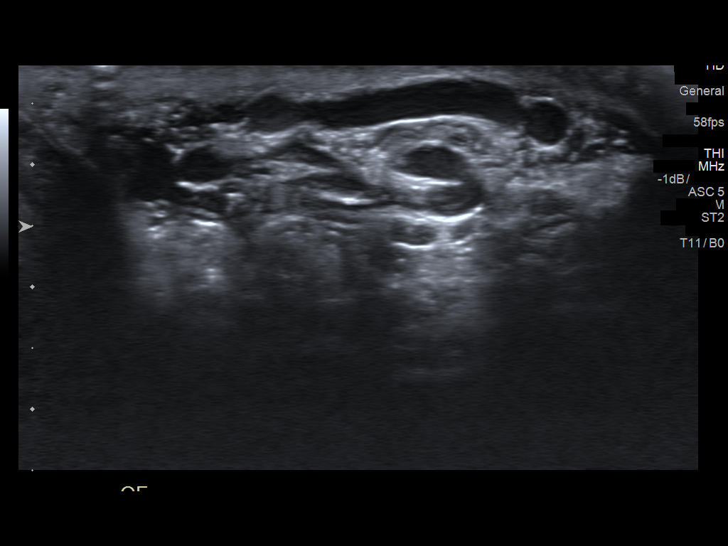
[im 73/73]
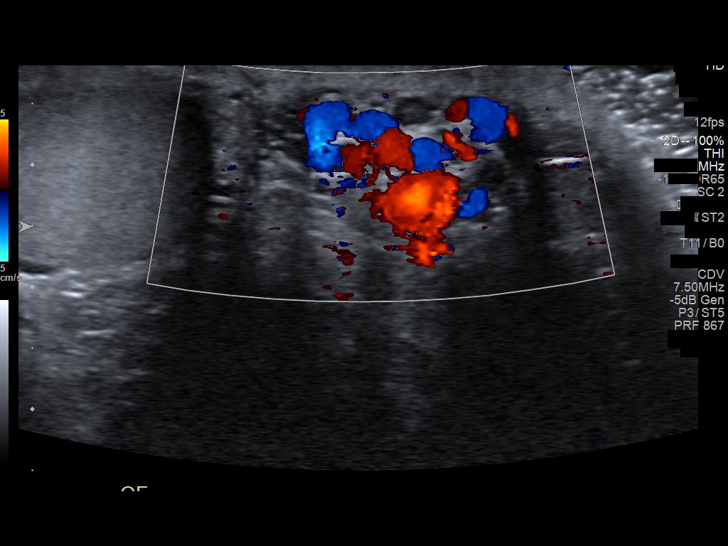

[13 of 25 positions shown; findings below may reference images not displayed]

FINDINGS: Right testicle

Measurements: 4.1 x 2.1 x 2.3 cm. Normal morphology without mass or
calcification. Internal blood flow present on color Doppler imaging.

Left testicle

Measurements: 3.3 x 1.9 x 2.2 cm. Normal morphology without mass or
calcification. Internal blood flow present on color Doppler imaging,
fairly symmetric with RIGHT.

Right epididymis:  Normal in size and appearance.

Left epididymis:  Normal in size and appearance.

Hydrocele: Tiny amount of RIGHT hydrocele fluid. No LEFT hydrocele.

Varicocele: LEFT varicocele present, corresponding to a palpable
abnormality the patient indicated.

Pulsed Doppler interrogation of both testes demonstrates normal low
resistance arterial and venous waveforms bilaterally.
IMPRESSION: LEFT varicocele, corresponding to area of palpable lump.

Otherwise normal appearance of the testes and epididymi.

## 2018-12-11 ENCOUNTER — Other Ambulatory Visit: Payer: Self-pay | Admitting: Gastroenterology

## 2018-12-11 ENCOUNTER — Telehealth: Payer: Self-pay | Admitting: Neurology

## 2018-12-11 NOTE — Telephone Encounter (Signed)
Since I don't specialize on headache, I don't think I can add much compared to another neurologist

## 2018-12-11 NOTE — Telephone Encounter (Signed)
I would rather not.   I'm not a headache specialist so would not really offer anything unique

## 2018-12-11 NOTE — Telephone Encounter (Signed)
Ok to switch 

## 2018-12-11 NOTE — Telephone Encounter (Signed)
Dr. Felecia Shelling- are you ok with the switch?

## 2018-12-11 NOTE — Telephone Encounter (Signed)
This pt last saw Dr Krista Blue 06/06/18 and has been referred back. They would like to see you. Is it ok to switch ?

## 2018-12-11 NOTE — Telephone Encounter (Signed)
You saw this pt 06/06/18. They have been referred back but would like to see Dr Felecia Shelling. Is it ok to switch?

## 2019-01-27 ENCOUNTER — Ambulatory Visit: Payer: PRIVATE HEALTH INSURANCE | Admitting: Neurology

## 2019-01-27 ENCOUNTER — Other Ambulatory Visit: Payer: Self-pay

## 2019-01-27 ENCOUNTER — Encounter: Payer: Self-pay | Admitting: Neurology

## 2019-01-27 ENCOUNTER — Encounter: Payer: Self-pay | Admitting: *Deleted

## 2019-01-27 VITALS — BP 119/85 | HR 105 | Temp 97.3°F | Ht 73.0 in | Wt 202.5 lb

## 2019-01-27 DIAGNOSIS — H5440 Blindness, one eye, unspecified eye: Secondary | ICD-10-CM | POA: Insufficient documentation

## 2019-01-27 DIAGNOSIS — G43109 Migraine with aura, not intractable, without status migrainosus: Secondary | ICD-10-CM

## 2019-01-27 DIAGNOSIS — R519 Headache, unspecified: Secondary | ICD-10-CM | POA: Insufficient documentation

## 2019-01-27 MED ORDER — SUMATRIPTAN SUCCINATE 100 MG PO TABS: 100.0000 mg | ORAL_TABLET | Freq: Once | ORAL | 11 refills | Status: DC | PRN

## 2019-01-27 MED ORDER — PROPRANOLOL HCL ER 60 MG PO CP24: 60.0000 mg | ORAL_CAPSULE | Freq: Every day | ORAL | 11 refills | Status: DC

## 2019-01-27 NOTE — Progress Notes (Signed)
PATIENT: Douglas Landry DOB: June 09, 1970  Chief Complaint  Patient presents with  . Migraine    Follow up to discuss other treatment options for migraines.  He would like to see if Botox would be beneficial.  He was previously on amitriptyline and topiramate but he was not able to tolerate the side effects.  He was also given Depakote but never tried the medication.  Marland Kitchen PCP    Gwenlyn Found, MD     HISTORICAL  Douglas Landry is a 48 year old male referred by his primary care physician Dr.Eksir, Lelon Mast A, for evaluation of chronic migraine headaches  I have reviewed and summarized referring note, he had a past medical history of anxiety, taking Wellbutrin, acid reflux, taking Prilosec, he had a history of congenital right eye blindness,  He reported history of migraine headaches since childhood, become more frequent since college, especially since 2016, he complains of increased frequency and severity of headaches, his typical migraine sometimes preceded by visual auras, light strikes in his visual field, lasting for few minutes followed by lateralized severe pounding headache with associated light noise sensitivity, mild nausea, lasting for 6 hours  He has been taking Imitrex 50 mg as needed, which helped him 50% of time, 25% of time he has to take second dose, 25% time it does not help him at all, especially the severe migraine headaches that woke him up from sleep.  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 Slight retention of contrast in the piriform sinuses, which does clear with repeat swallow. Sliding hiatal hernia  with patulous gastroesophageal junction. No discrete esophagitis or stricture.  He was treated with amitriptyline 125 mg since January 2020, which has helped his migraine some, but he complains of side effect of significant dry mouth  UPDATE Jan 27 2019: He could not tolerate Topamax either, complains of mental slowing, he is no longer taking topamax or Amitriptyline, He now has headache daily since August 2020, twice a week, it would evolved into more severe headache, throbbing, with light, noise sensitivity, nauseous, lasting all day, overcounter ibuprofen does not help.  Imitrex 100mg  was helpful, but he used up his monthly supply quickly      REVIEW OF SYSTEMS: Full 14 system review of systems performed and notable only for as above. All other review of systems were negative.  ALLERGIES: No Known Allergies  HOME MEDICATIONS: Current Outpatient Medications  Medication Sig Dispense Refill  . buPROPion (WELLBUTRIN XL) 300 MG 24 hr tablet Take 1 tablet (300 mg total) by mouth daily. 90 tablet 0  . SUMAtriptan (IMITREX) 100 MG tablet Take 1 tablet (100 mg total) by mouth once as needed for up to 1 dose for migraine. May repeat in 2 hours if headache persists or recurs. 12 tablet 6  . omeprazole (PRILOSEC) 20 MG capsule Take 1 capsule (20 mg total) by mouth daily. 30 capsule 11   No current facility-administered medications for this visit.     PAST MEDICAL HISTORY: Past Medical History:  Diagnosis Date  . Allergy   . Anxiety   . Blind right eye   . Cataract    right eye removed - as an infant - blind in  right eye  . Chicken pox   . Depression   . Frequent headaches   . GERD (gastroesophageal reflux disease)   . Migraines     PAST SURGICAL HISTORY: Past Surgical History:  Procedure Laterality Date  . CATARACT EXTRACTION     right eye as an infant - blind in right eye  . 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 FRACTURE SURGERY Left    left hand fracture  reset 2017  . WISDOM TOOTH EXTRACTION      FAMILY HISTORY: Family History  Problem Relation Age of Onset  . Arthritis Mother   . Hyperlipidemia Mother   . Hypertension Mother   . Depression Mother   . Heart disease Father   . Arthritis Maternal Grandfather   . Depression Brother   . Suicidality Paternal Grandfather   . Colon cancer Neg Hx   . Esophageal cancer Neg Hx   . Rectal cancer Neg Hx   . Stomach cancer Neg Hx     SOCIAL HISTORY: Social History   Socioeconomic History  . Marital status: Married    Spouse name: Not on file  . Number of children: Not on file  . Years of education: Not on file  . Highest education level: Not on file  Occupational History  . Occupation: IT specialist  Social Needs  . Financial resource strain: Not on file  . Food insecurity    Worry: Not on file    Inability: Not on file  . Transportation needs    Medical: Not on file    Non-medical: Not on file  Tobacco Use  . Smoking status: Never Smoker  . Smokeless tobacco: Never Used  Substance and Sexual Activity  . Alcohol use: No  . Drug use: No  . Sexual activity: Yes    Partners: Female  Lifestyle  . Physical activity    Days per week: Not on file    Minutes per session: Not on file  . Stress: Not on file  Relationships  . Social Herbalist on phone: Not on file    Gets together: Not on file    Attends religious service: Not on file    Active member of club or organization: Not on file    Attends meetings of clubs or organizations: Not on file    Relationship status: Not on file  . Intimate partner violence    Fear of current or ex partner: Not on file    Emotionally abused: Not on file    Physically abused: Not on file    Forced sexual activity: Not on file  Other Topics Concern  . Not on file  Social History Narrative  . Not on file     PHYSICAL EXAM   Vitals:   01/27/19 0831  BP: 119/85  Pulse: (!) 105  Temp: (!) 97.3 F (36.3 C)  Weight: 202  lb 8 oz (91.9 kg)  Height: 6\' 1"  (1.854 m)    Not recorded      Body mass index is 26.72 kg/m.  PHYSICAL EXAMNIATION:  Gen: NAD, conversant, well nourised, well groomed                     Cardiovascular: Regular rate rhythm, no peripheral edema, warm, nontender. Eyes: Conjunctivae clear without exudates or hemorrhage Neck: Supple, no carotid bruits. Pulmonary: Clear to auscultation bilaterally   NEUROLOGICAL EXAM:  MENTAL STATUS: Speech:  Speech is normal; fluent and spontaneous with normal comprehension.  Cognition:     Orientation to time, place and person     Normal recent and remote memory     Normal Attention span and concentration     Normal Language, naming, repeating,spontaneous speech     Fund of knowledge   CRANIAL NERVES: CN II: Visual fields are full to confrontation. Right eye was opaque blind, left pupil is round reactive. CN III, IV, VI: extraocular movement are normal. No ptosis. CN V: Facial sensation is intact to pinprick in all 3 divisions bilaterally. Corneal responses are intact.  CN VII: Face is symmetric with normal eye closure and smile. CN VIII: Hearing is normal to causal conversation. CN IX, X: Palate elevates symmetrically. Phonation is normal. CN XI: Head turning and shoulder shrug are intact CN XII: Tongue is midline with normal movements and no atrophy.  MOTOR: There is no pronator drift of out-stretched arms. Muscle bulk and tone are normal. Muscle strength is normal.  REFLEXES: Reflexes are 2+ and symmetric at the biceps, triceps, knees, and ankles. Plantar responses are flexor.  SENSORY: Intact to light touch, pinprick, positional sensation and vibratory sensation are intact in fingers and toes.  COORDINATION: Rapid alternating movements and fine finger movements are intact. There is no dysmetria on finger-to-nose and heel-knee-shin.    GAIT/STANCE: Posture is normal. Gait is steady with normal steps, base, arm swing, and  turning. Heel and toe walking are normal. Tandem gait is normal.  Romberg is absent.   DIAGNOSTIC DATA (LABS, IMAGING, TESTING) - I reviewed patient records, labs, notes, testing and imaging myself where available.   ASSESSMENT AND PLAN  Emelia SalisburyShannon Savary is a 48 y.o. male   Chronic daily headache New Daily headache  MRI brain to rule out CNS structure lesion  Add on Inderal LA 60mg  qhs as preventive medications  Imitrex prn  May consider CGRP antagonist as preventive medications   Levert FeinsteinYijun Chloeann Alfred, M.D. Ph.D.  [facility] [address], [address] [place], [place] [address] Ph: [phone] Fax: [phone]  CC: Referring Provider

## 2019-01-28 LAB — COMPREHENSIVE METABOLIC PANEL
ALT: 38 IU/L (ref 0–44)
AST: 26 IU/L (ref 0–40)
Albumin/Globulin Ratio: 2.1 (ref 1.2–2.2)
Albumin: 4.6 g/dL (ref 4.0–5.0)
Alkaline Phosphatase: 98 IU/L (ref 39–117)
BUN/Creatinine Ratio: 15 (ref 9–20)
BUN: 15 mg/dL (ref 6–24)
Bilirubin Total: 0.4 mg/dL (ref 0.0–1.2)
CO2: 26 mmol/L (ref 20–29)
Calcium: 9.8 mg/dL (ref 8.7–10.2)
Chloride: 99 mmol/L (ref 96–106)
Creatinine, Ser: 1.01 mg/dL (ref 0.76–1.27)
GFR calc Af Amer: 102 mL/min/{1.73_m2} (ref >59)
GFR calc non Af Amer: 88 mL/min/{1.73_m2} (ref >59)
Globulin, Total: 2.2 g/dL (ref 1.5–4.5)
Glucose: 80 mg/dL (ref 65–99)
Potassium: 4.7 mmol/L (ref 3.5–5.2)
Sodium: 140 mmol/L (ref 134–144)
Total Protein: 6.8 g/dL (ref 6.0–8.5)

## 2019-01-28 LAB — CBC
Hematocrit: 45.9 % (ref 37.5–51.0)
Hemoglobin: 15.1 g/dL (ref 13.0–17.7)
MCH: 29 pg (ref 26.6–33.0)
MCHC: 32.9 g/dL (ref 31.5–35.7)
MCV: 88 fL (ref 79–97)
Platelets: 200 10*3/uL (ref 150–450)
RBC: 5.2 x10E6/uL (ref 4.14–5.80)
RDW: 13.3 % (ref 11.6–15.4)
WBC: 7.7 10*3/uL (ref 3.4–10.8)

## 2019-01-28 LAB — TSH: TSH: 1.83 u[IU]/mL (ref 0.450–4.500)

## 2019-02-02 ENCOUNTER — Telehealth: Payer: Self-pay

## 2019-02-02 NOTE — Telephone Encounter (Signed)
[employer]. Order sent to [facility]. They will obtain auth and call patient to schedule.

## 2019-02-11 ENCOUNTER — Other Ambulatory Visit: Payer: Self-pay

## 2019-02-11 ENCOUNTER — Ambulatory Visit
Admission: RE | Admit: 2019-02-11 | Discharge: 2019-02-11 | Disposition: A | Discharge status: Home / Self Care | Payer: PRIVATE HEALTH INSURANCE | Source: Ambulatory Visit | Attending: Neurology | Admitting: Neurology

## 2019-02-11 DIAGNOSIS — R519 Headache, unspecified: Secondary | ICD-10-CM | POA: Diagnosis not present

## 2019-02-16 ENCOUNTER — Telehealth: Payer: Self-pay | Admitting: Neurology

## 2019-02-16 MED ORDER — AIMOVIG 70 MG/ML ~~LOC~~ SOAJ: 1.0000 mL | SUBCUTANEOUS | 3 refills | Status: DC

## 2019-02-16 NOTE — Telephone Encounter (Signed)
Left second message requesting a return call. 

## 2019-02-16 NOTE — Addendum Note (Signed)
Addended by: Noberto Retort C on: 02/16/2019 11:57 AM   Modules accepted: Orders

## 2019-02-16 NOTE — Telephone Encounter (Signed)
Please check on patient, MRI of brain showed mild age related changes, no significant abnormalities, also check on his headache, if he still has frequent migraines, will start aimovig 70mg  weekly  IMPRESSION: This MRI of the brain without contrast shows the following: 1.   There are some scattered T2/FLAIR hyperintense foci in the subcortical and deep white matter of the hemispheres.  This is a nonspecific finding and most likely represents very mild chronic microvascular ischemic change or the sequela of migraine headaches.  Demyelination would be less likely to have this appearance. 2.   There are no acute findings.

## 2019-02-16 NOTE — Telephone Encounter (Signed)
I was able to speak to the patient and he verbalized understanding of his MRI results.  States he is still having frequent migraines and would like to try Aimovig 70mg , one injection monthly, as an added medication to his treatment plan.  New rx sent to the pharmacy.

## 2019-02-16 NOTE — Telephone Encounter (Signed)
Left message requesting a return call.

## 2019-02-17 ENCOUNTER — Telehealth: Payer: Self-pay | Admitting: *Deleted

## 2019-02-17 NOTE — Telephone Encounter (Signed)
PA completed through [email or web address] (key: [id number]).  Pt has coverage through [employer] [phone]).  PA approved through 02/17/2020.  Case BB#[id number].

## 2020-01-19 ENCOUNTER — Other Ambulatory Visit: Payer: Self-pay | Admitting: Neurology

## 2020-04-13 ENCOUNTER — Ambulatory Visit
Admission: RE | Admit: 2020-04-13 | Discharge: 2020-04-13 | Disposition: A | Discharge status: Home / Self Care | Payer: PRIVATE HEALTH INSURANCE | Source: Ambulatory Visit | Attending: Family Medicine | Admitting: Family Medicine

## 2020-04-13 ENCOUNTER — Other Ambulatory Visit: Payer: Self-pay | Admitting: Family Medicine

## 2020-04-13 DIAGNOSIS — M778 Other enthesopathies, not elsewhere classified: Secondary | ICD-10-CM

## 2020-06-10 DIAGNOSIS — F909 Attention-deficit hyperactivity disorder, unspecified type: Secondary | ICD-10-CM | POA: Insufficient documentation

## 2021-04-22 ENCOUNTER — Encounter (HOSPITAL_COMMUNITY): Payer: Self-pay | Admitting: Emergency Medicine

## 2021-04-22 ENCOUNTER — Ambulatory Visit (HOSPITAL_COMMUNITY)
Admission: EM | Admit: 2021-04-22 | Discharge: 2021-04-22 | Disposition: A | Discharge status: Home / Self Care | Payer: PRIVATE HEALTH INSURANCE

## 2021-04-22 DIAGNOSIS — Z113 Encounter for screening for infections with a predominantly sexual mode of transmission: Secondary | ICD-10-CM

## 2021-04-22 NOTE — ED Triage Notes (Signed)
Pt requesting STD testing today. Denies exposures. Reports had cold sore recently.

## 2021-04-22 NOTE — ED Provider Notes (Signed)
Patient advised that we are not able to call for any blood test for active HSV infection.     Theadora Rama Scales, PA-C 04/22/21 1825

## 2021-05-04 ENCOUNTER — Other Ambulatory Visit: Payer: Self-pay | Admitting: Specialist

## 2021-05-04 DIAGNOSIS — G971 Other reaction to spinal and lumbar puncture: Secondary | ICD-10-CM

## 2021-05-09 ENCOUNTER — Other Ambulatory Visit: Payer: Self-pay

## 2021-05-09 ENCOUNTER — Ambulatory Visit
Admission: RE | Admit: 2021-05-09 | Discharge: 2021-05-09 | Disposition: A | Discharge status: Home / Self Care | Payer: PRIVATE HEALTH INSURANCE | Source: Ambulatory Visit | Attending: Specialist | Admitting: Specialist

## 2021-05-09 VITALS — BP 134/84 | HR 86

## 2021-05-09 DIAGNOSIS — R519 Headache, unspecified: Secondary | ICD-10-CM

## 2021-05-09 DIAGNOSIS — G971 Other reaction to spinal and lumbar puncture: Secondary | ICD-10-CM

## 2021-05-09 DIAGNOSIS — G8929 Other chronic pain: Secondary | ICD-10-CM

## 2021-05-09 NOTE — Discharge Instructions (Signed)

## 2021-05-15 ENCOUNTER — Other Ambulatory Visit: Payer: Self-pay

## 2021-05-15 ENCOUNTER — Other Ambulatory Visit: Payer: Self-pay | Admitting: Specialist

## 2021-05-15 ENCOUNTER — Ambulatory Visit
Admission: RE | Admit: 2021-05-15 | Discharge: 2021-05-15 | Disposition: A | Discharge status: Home / Self Care | Payer: PRIVATE HEALTH INSURANCE | Source: Ambulatory Visit | Attending: Specialist | Admitting: Specialist

## 2021-05-15 DIAGNOSIS — G971 Other reaction to spinal and lumbar puncture: Secondary | ICD-10-CM

## 2021-05-15 MED ORDER — IOPAMIDOL (ISOVUE-M 200) INJECTION 41%
1.0000 mL | Freq: Once | INTRAMUSCULAR | Status: AC
  Administered 2021-05-15: 1 mL via EPIDURAL

## 2021-05-15 NOTE — Progress Notes (Signed)
20cc of blood drawn from pts RAC for blood patch. 1 successful attempt. Pt tolerated well. Gauze and tape applied after.

## 2021-05-15 NOTE — Discharge Instructions (Signed)
Epidural Blood Patch Discharge Instructions  Go home and rest quietly for the next 24 hours.  It is important to lie flat for the next 24 hours.  Get up only to go to the restroom.  You may lie in the bed or on a couch on your back, your stomach, your left side or your right side.  You may have one pillow under your head.  You may have pillows between your knees while you are on your side or under your knees while you are on your back.  DO NOT drive today.  Recline the seat as far back as it will go, while still wearing your seat belt, on the way home.  You may get up to go to the bathroom as needed.  You may sit up for 10-15 minutes to eat.  You may resume your normal diet and medications unless otherwise indicated.  Drink plenty of extra fluids today and tomorrow.  Caffeine may help your body make spinal fluid faster, so add or increase your caffeine intake as much as possible.    You may resume normal activities after your 24 hours of bed rest is over; however, do not exert yourself strongly or do any heavy lifting tomorrow.

## 2021-06-07 LAB — CRYPTOCOCCAL AG, LTX SCR RFLX TITER
Cryptococcal Ag Screen: NOT DETECTED
MICRO NUMBER:: 13036654
SPECIMEN QUALITY:: ADEQUATE

## 2021-06-07 LAB — VDRL, CSF: VDRL Quant, CSF: NONREACTIVE

## 2021-06-07 LAB — GLUCOSE, CSF: Glucose, CSF: 65 mg/dL (ref 40–80)

## 2021-06-07 LAB — FUNGUS CULTURE W SMEAR
CULTURE:: NO GROWTH
MICRO NUMBER:: 13036655
SMEAR:: NONE SEEN
SPECIMEN QUALITY:: ADEQUATE

## 2021-06-07 LAB — B. BURGDORFI ANTIBODIES, CSF: Lyme Ab: NEGATIVE

## 2021-06-07 LAB — MYCOBACTERIUM TUBERCULOSIS COMPLEX: MTB Complex,PCR,Non-Resp.: NOT DETECTED

## 2021-06-07 LAB — HERPES SIMPLEX VIRUS 1/2 (IGG), CSF
HSV 1 IgG Index:: <0.01
HSV 2 IgG Index:: <0.01

## 2021-06-07 LAB — CSF CELL COUNT WITH DIFFERENTIAL
RBC Count, CSF: 0 cells/uL
WBC, CSF: 1 cells/uL (ref 0–5)

## 2021-06-07 LAB — PROTEIN, CSF: Total Protein, CSF: 35 mg/dL (ref 15–45)

## 2021-06-07 LAB — ANGIOTENSIN CONVERTING ENZYME, CSF: ANGIOTENSIN CONVERTING ENZYME ( ACE) CSF: 4 U/L (ref <=15)

## 2022-01-15 DIAGNOSIS — K219 Gastro-esophageal reflux disease without esophagitis: Secondary | ICD-10-CM | POA: Insufficient documentation

## 2022-01-15 DIAGNOSIS — R7309 Other abnormal glucose: Secondary | ICD-10-CM | POA: Insufficient documentation

## 2022-01-15 DIAGNOSIS — E7800 Pure hypercholesterolemia, unspecified: Secondary | ICD-10-CM | POA: Insufficient documentation

## 2022-01-15 DIAGNOSIS — R7301 Impaired fasting glucose: Secondary | ICD-10-CM | POA: Insufficient documentation

## 2022-01-15 NOTE — Patient Instructions (Signed)
Migraine Headache  A migraine headache is a very strong throbbing pain on one side or both sides of your head. This type of headache can also cause other symptoms. It can last from 4 hours to 3 days. Talk with your doctor about what things may bring on (trigger) this condition.  What are the causes?  The exact cause of this condition is not known. This condition may be triggered or caused by:  Drinking alcohol.  Smoking.  Taking medicines, such as:  Medicine used to treat chest pain (nitroglycerin).  Birth control pills.  Estrogen.  Some blood pressure medicines.  Eating or drinking certain products.  Doing physical activity.  Other things that may trigger a migraine headache include:  Having a menstrual period.  Pregnancy.  Hunger.  Stress.  Not getting enough sleep or getting too much sleep.  Weather changes.  Tiredness (fatigue).  What increases the risk?  Being 25-55 years old.  Being male.  Having a family history of migraine headaches.  Being Caucasian.  Having depression or anxiety.  Being very overweight.  What are the signs or symptoms?  A throbbing pain. This pain may:  Happen in any area of the head, such as on one side or both sides.  Make it hard to do daily activities.  Get worse with physical activity.  Get worse around [place] lights or loud noises.  Other symptoms may include:  Feeling sick to your stomach (nauseous).  Vomiting.  Dizziness.  Being sensitive to [place] lights, loud noises, or smells.  Before you get a migraine headache, you may get warning signs (an aura). An aura may include:  Seeing flashing lights or having blind spots.  Seeing [place] spots, halos, or zigzag lines.  Having tunnel vision or blurred vision.  Having numbness or a tingling feeling.  Having trouble talking.  Having weak muscles.  Some people have symptoms after a migraine headache (postdromal phase), such as:  Tiredness.  Trouble thinking (concentrating).  How is this treated?  Taking medicines that:  Relieve  pain.  Relieve the feeling of being sick to your stomach.  Prevent migraine headaches.  Treatment may also include:  Having acupuncture.  Avoiding foods that bring on migraine headaches.  Learning ways to control your body functions (biofeedback).  Therapy to help you know and deal with negative thoughts (cognitive behavioral therapy).  Follow these instructions at home:  Medicines  Take over-the-counter and prescription medicines only as told by your doctor.  Ask your doctor if the medicine prescribed to you:  Requires you to avoid driving or using heavy machinery.  Can cause trouble pooping (constipation). You may need to take these steps to prevent or treat trouble pooping:  Drink enough fluid to keep your pee (urine) pale yellow.  Take over-the-counter or prescription medicines.  Eat foods that are high in fiber. These include beans, whole grains, and fresh fruits and vegetables.  Limit foods that are high in fat and sugar. These include fried or sweet foods.  Lifestyle  Do not drink alcohol.  Do not use any products that contain nicotine or tobacco, such as cigarettes, e-cigarettes, and chewing tobacco. If you need help quitting, ask your doctor.  Get at least 8 hours of sleep every night.  Limit and deal with stress.  General instructions  Keep a journal to find out what may bring on your migraine headaches. For example, write down:  What you eat and drink.  How much sleep you get.  Any change in   what you eat or drink.  Any change in your medicines.  If you have a migraine headache:  Avoid things that make your symptoms worse, such as [place] lights.  It may help to lie down in a dark, quiet room.  Do not drive or use heavy machinery.  Ask your doctor what activities are safe for you.  Keep all follow-up visits as told by your doctor. This is important.  Contact a doctor if:  You get a migraine headache that is different or worse than others you have had.  You have more than 15 headache days in one month.  Get  help right away if:  Your migraine headache gets very bad.  Your migraine headache lasts longer than 72 hours.  You have a fever.  You have a stiff neck.  You have trouble seeing.  Your muscles feel weak or like you cannot control them.  You start to lose your balance a lot.  You start to have trouble walking.  You pass out (faint).  You have a seizure.  Summary  A migraine headache is a very strong throbbing pain on one side or both sides of your head. These headaches can also cause other symptoms.  This condition may be treated with medicines and changes to your lifestyle.  Keep a journal to find out what may bring on your migraine headaches.  Contact a doctor if you get a migraine headache that is different or worse than others you have had.  Contact your doctor if you have more than 15 headache days in a month.  This information is not intended to replace advice given to you by your health care provider. Make sure you discuss any questions you have with your health care provider.  Document Revised: 08/17/2021 Document Reviewed: 04/17/2018  Elsevier Patient Education  2023 [employer] Inc[employer]

## 2022-01-18 ENCOUNTER — Ambulatory Visit (INDEPENDENT_AMBULATORY_CARE_PROVIDER_SITE_OTHER): Payer: 59 | Admitting: Nurse Practitioner

## 2022-01-18 ENCOUNTER — Encounter: Payer: Self-pay | Admitting: Nurse Practitioner

## 2022-01-18 VITALS — BP 120/74 | HR 98 | Temp 97.9°F | Ht 72.0 in | Wt 175.3 lb

## 2022-01-18 DIAGNOSIS — H5440 Blindness, one eye, unspecified eye: Secondary | ICD-10-CM

## 2022-01-18 DIAGNOSIS — R7301 Impaired fasting glucose: Secondary | ICD-10-CM

## 2022-01-18 DIAGNOSIS — K219 Gastro-esophageal reflux disease without esophagitis: Secondary | ICD-10-CM

## 2022-01-18 DIAGNOSIS — F3342 Major depressive disorder, recurrent, in full remission: Secondary | ICD-10-CM

## 2022-01-18 DIAGNOSIS — G43009 Migraine without aura, not intractable, without status migrainosus: Secondary | ICD-10-CM | POA: Diagnosis not present

## 2022-01-18 DIAGNOSIS — Z23 Encounter for immunization: Secondary | ICD-10-CM | POA: Diagnosis not present

## 2022-01-18 DIAGNOSIS — N400 Enlarged prostate without lower urinary tract symptoms: Secondary | ICD-10-CM

## 2022-01-18 DIAGNOSIS — F902 Attention-deficit hyperactivity disorder, combined type: Secondary | ICD-10-CM

## 2022-01-18 DIAGNOSIS — Z7689 Persons encountering health services in other specified circumstances: Secondary | ICD-10-CM

## 2022-01-18 DIAGNOSIS — E7800 Pure hypercholesterolemia, unspecified: Secondary | ICD-10-CM

## 2022-01-18 NOTE — Assessment & Plan Note (Signed)
Noted on past labs, check lipid panel today -- although may need to repeat as not fasting.

## 2022-01-18 NOTE — Assessment & Plan Note (Signed)
Noted on past labs, check CMP and A1c today.  Adjust plan as needed.

## 2022-01-18 NOTE — Assessment & Plan Note (Signed)
Chronic, ongoing.  Followed by headache clinic in [place], continue this collaboration and medications as ordered by them.  Will attempt to get notes at next visit.

## 2022-01-18 NOTE — Assessment & Plan Note (Signed)
Chronic, ongoing.  Followed by [facility] -- continue this collaboration and medications as ordered by them.  Attempt to get notes next visit.

## 2022-01-18 NOTE — Assessment & Plan Note (Signed)
Since childhood, monitor only.

## 2022-01-18 NOTE — Progress Notes (Signed)
New Patient Office Visit  Subjective    Patient ID: Douglas Landry, male    DOB: [birth year]  Age: 51 y.o. MRN: [id number]  CC:  Chief Complaint  Patient presents with   New Patient (Initial Visit)    Patient is here a New Patient to Establish Care. Patient is requesting a wellness exam for insurance purpose patient says the exam is due before the 20th of November.     HPI Douglas Landry presents for new patient visit to establish care.  Introduced to Designer, jewellery role and practice setting.  All questions answered.  Discussed provider/patient relationship and expectations.  Has seen a primary care provider but they are in [place], so looking to move closer.  Continues on Omeprazole for GERD, only takes once daily - not as ordered BID.  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  - Had colonoscopy last year, not in Epic, will try to find out who performed this and alert provider. - Needs flu shot (will get today) and Shingrix (will obtain outpatient).  MIGRAINES In past followed by neurology, last visit was 01/27/2019 with [facility] -- currently followed by headache clinic in [place] and they are not on [place].  Currently taking Emgality & Zonegran + obtains injections in office from headache provider (not Botox).  On review has taken Aimovig, Propranolol, and Imitrex in past.   Duration: chronic Onset: gradual Severity: 10/10 Quality: dull, aching, and throbbing Frequency: intermittent Location: starts around neck and into anterior head, occasionally to one area Headache duration: 24 hours  Radiation: no Time of day headache occurs: often strike in middle of night around 3 am Alleviating factors: Injections and medications Aggravating factors: chocolate, caffeine absence  Headache status at time of visit: asymptomatic Treatments attempted: Aimovig, Propranolol, and  Imitrex Aura: no -- in past Nausea:  yes Vomiting: yes Photophobia:  no Phonophobia:  occasional Effect on social functioning:  no Numbers of missed days of school/work each month: 0 Confusion:  no Gait disturbance/ataxia:  no Behavioral changes:  no Fevers:  no   DEPRESSION & ADHD Followed by Douglas Landry for psychiatry.  Currently taking Wellbutrin and Adderall.   Mood status: stable Satisfied with current treatment?: yes Symptom severity: moderate  Duration of current treatment : chronic Side effects: no Medication compliance: good compliance Psychotherapy/counseling: none Previous psychiatric medications: multiple medications Depressed mood: no Anxious mood:  occasional Anhedonia: yes Significant weight loss or gain: no Insomnia: yes hard to fall asleep Fatigue:  occasional mid afternoon Feelings of worthlessness or guilt: no Impaired concentration/indecisiveness: yes Suicidal ideations: no Hopelessness: no Crying spells: no    01/18/2022    8:48 AM 06/06/2016    3:57 PM  Depression screen PHQ 2/9  Decreased Interest 1 0  Down, Depressed, Hopeless 0 0  PHQ - 2 Score 1 0  Altered sleeping 2   Tired, decreased energy 2   Change in appetite 0   Feeling bad or failure about yourself  3   Trouble concentrating 3   Moving slowly or fidgety/restless 1   Suicidal thoughts 0   PHQ-9 Score 12   Difficult doing work/chores Somewhat difficult        01/18/2022    8:51 AM  GAD 7 : Generalized Anxiety Score  Nervous, Anxious, on Edge 3  Control/stop worrying 0  Worry too much - different things 0  Trouble relaxing  1  Restless 1  Easily annoyed or irritable 1  Afraid - awful might happen 1  Total GAD 7 Score 7  Anxiety Difficulty Somewhat difficult      04/22/2021    5:47 PM 01/18/2022    8:53 AM  Fall Risk  Falls in the past year?  0  Was there an injury with Fall?  0  Fall Risk Category Calculator  0  Fall Risk Category  Low  Patient Fall Risk Level Low  fall risk Low fall risk  Patient at Risk for Falls Due to  No Fall Risks  Fall risk Follow up  Education provided    Functional Status Survey: Is the patient deaf or have difficulty hearing?: No Does the patient have difficulty seeing, even when wearing glasses/contacts?: No Does the patient have difficulty concentrating, remembering, or making decisions?: No Does the patient have difficulty walking or climbing stairs?: No Does the patient have difficulty dressing or bathing?: No Does the patient have difficulty doing errands alone such as visiting a doctor's office or shopping?: No     Outpatient Encounter Medications as of 01/18/2022  Medication Sig   amphetamine-dextroamphetamine (ADDERALL XR) 15 MG 24 hr capsule Take by mouth every morning.   buPROPion (WELLBUTRIN XL) 150 MG 24 hr tablet Take 150 mg by mouth every morning.   buPROPion (WELLBUTRIN XL) 300 MG 24 hr tablet Take 1 tablet (300 mg total) by mouth daily.   EMGALITY 120 MG/ML SOAJ Inject into the skin.   zonisamide (ZONEGRAN) 100 MG capsule Take 300 mg by mouth daily.   omeprazole (PRILOSEC) 20 MG capsule Take 1 capsule (20 mg total) by mouth daily.   [DISCONTINUED] Erenumab-aooe (AIMOVIG) 70 MG/ML SOAJ Inject 1 mL into the skin every 30 (thirty) days. (Patient not taking: Reported on 01/18/2022)   [DISCONTINUED] propranolol ER (INDERAL LA) 60 MG 24 hr capsule Take 1 capsule (60 mg total) by mouth daily. (Patient not taking: Reported on 01/18/2022)   [DISCONTINUED] SUMAtriptan (IMITREX) 100 MG tablet Take 1 tablet (100 mg total) by mouth once as needed for up to 1 dose for migraine. May repeat in 2 hours if headache persists or recurs. (Patient not taking: Reported on 01/18/2022)   No facility-administered encounter medications on file as of 01/18/2022.    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   Chicken pox     Depression    Frequent headaches    GERD (gastroesophageal reflux disease)    Migraines     Past Surgical History:  Procedure Laterality Date   CATARACT EXTRACTION     right eye as an infant - blind in right eye   EYE SURGERY     lens trasplant right eye [phone] x5   FRACTURE SURGERY Left    left hand fracture reset 2017   WISDOM TOOTH EXTRACTION      Family History  Problem Relation Age of Onset   Arthritis Mother    Hyperlipidemia Mother    Hypertension Mother    Depression Mother    Heart attack Father        stents placed   Heart disease Father    Depression Brother    Dementia Maternal Grandmother    Arthritis Maternal Grandfather    Suicidality Paternal Grandfather    Colon cancer Neg Hx  Esophageal cancer Neg Hx    Rectal cancer Neg Hx    Stomach cancer Neg Hx     Social History   Socioeconomic History   Marital status: Married    Spouse name: Not on file   Number of children: 3   Years of education: Not on file   Highest education level: Not on file  Occupational History   Occupation: IT specialist  Tobacco Use   Smoking status: Never   Smokeless tobacco: Never  Vaping Use   Vaping Use: Never used  Substance and Sexual Activity   Alcohol use: No   Drug use: No   Sexual activity: Yes    Partners: Female  Other Topics Concern   Not on file  Social History Narrative   3 daughters   Social Determinants of Health   Financial Resource Strain: Medium Risk (01/18/2022)   Overall Financial Resource Strain (CARDIA)    Difficulty of Paying Living Expenses: Somewhat hard  Food Insecurity: No Food Insecurity (01/18/2022)   Hunger Vital Sign    Worried About Running Out of Food in the Last Year: Never true    Ran Out of Food in the Last Year: Never true  Transportation Needs: No Transportation Needs (01/18/2022)   PRAPARE - Administrator, Civil Service (Medical): No    Lack of Transportation (Non-Medical): No  Physical Activity:  Inactive (01/18/2022)   Exercise Vital Sign    Days of Exercise per Week: 0 days    Minutes of Exercise per Session: 0 min  Stress: Stress Concern Present (01/18/2022)   [employer] of Occupational Health - Occupational Stress Questionnaire    Feeling of Stress : Rather much  Social Connections: Moderately Integrated (01/18/2022)   Social Connection and Isolation Panel [NHANES]    Frequency of Communication with Friends and Family: More than three times a week    Frequency of Social Gatherings with Friends and Family: More than three times a week    Attends Religious Services: More than 4 times per year    Active Member of [employer] or Organizations: No    Attends Banker Meetings: Never    Marital Status: Married  Catering manager Violence: Not At Risk (01/18/2022)   Humiliation, Afraid, Rape, and Kick questionnaire    Fear of Current or Ex-Partner: No    Emotionally Abused: No    Physically Abused: No    Sexually Abused: No    Review of Systems  Constitutional:  Negative for fever, malaise/fatigue and weight loss.  Respiratory:  Negative for cough, sputum production and shortness of breath.   Cardiovascular: Negative.   Gastrointestinal: Negative.   Musculoskeletal: Negative.   Neurological:  Positive for headaches. Negative for tremors, focal weakness and weakness.  Psychiatric/Behavioral:  Negative for depression, memory loss and suicidal ideas. The patient is nervous/anxious and has insomnia.       Objective    BP 120/74   Pulse 98 Comment: apical  Temp 97.9 F (36.6 C) (Oral)   Ht 6' (1.829 m)   Wt 175 lb 4.8 oz (79.5 kg)   SpO2 99%   BMI 23.77 kg/m   Physical Exam Vitals and nursing note reviewed.  Constitutional:      General: He is awake. He is not in acute distress.    Appearance: He is well-developed and well-groomed. He is not ill-appearing or toxic-appearing.  HENT:     Head: Normocephalic and atraumatic.     Right Ear: Hearing, tympanic  membrane,  ear canal and external ear normal. No drainage.     Left Ear: Hearing, tympanic membrane, ear canal and external ear normal. No drainage.     Nose: Nose normal.     Mouth/Throat:     Pharynx: Uvula midline.  Eyes:     General: Lids are normal.        Right eye: No discharge.        Left eye: No discharge.     Extraocular Movements: Extraocular movements intact.     Conjunctiva/sclera: Conjunctivae normal.     Pupils: Pupils are equal, round, and reactive to light.     Visual Fields: Right eye visual fields normal and left eye visual fields normal.  Neck:     Thyroid: No thyromegaly.     Vascular: No carotid bruit.     Trachea: Trachea normal.  Cardiovascular:     Rate and Rhythm: Normal rate and regular rhythm.     Heart sounds: Normal heart sounds, S1 normal and S2 normal. No murmur heard.    No gallop.  Pulmonary:     Effort: Pulmonary effort is normal. No accessory muscle usage or respiratory distress.     Breath sounds: Normal breath sounds.  Abdominal:     General: Bowel sounds are normal. There is no distension.     Palpations: Abdomen is soft.     Tenderness: There is no abdominal tenderness.  Musculoskeletal:        General: Normal range of motion.     Cervical back: Normal range of motion and neck supple.     Right lower leg: No edema.     Left lower leg: No edema.  Lymphadenopathy:     Head:     Right side of head: No submental, submandibular, tonsillar, preauricular or posterior auricular adenopathy.     Left side of head: No submental, submandibular, tonsillar, preauricular or posterior auricular adenopathy.     Cervical: No cervical adenopathy.  Skin:    General: Skin is warm and dry.     Capillary Refill: Capillary refill takes less than 2 seconds.     Findings: No rash.  Neurological:     Mental Status: He is alert and oriented to person, place, and time.     Gait: Gait is intact.     Deep Tendon Reflexes: Reflexes are normal and symmetric.      Reflex Scores:      Brachioradialis reflexes are 2+ on the right side and 2+ on the left side.      Patellar reflexes are 2+ on the right side and 2+ on the left side. Psychiatric:        Attention and Perception: Attention normal.        Mood and Affect: Mood normal.        Speech: Speech normal.        Behavior: Behavior normal. Behavior is cooperative.        Thought Content: Thought content normal.        Cognition and Memory: Cognition normal.    Last CBC Lab Results  Component Value Date   WBC 7.7 01/27/2019   HGB 15.1 01/27/2019   HCT 45.9 01/27/2019   MCV 88 01/27/2019   MCH 29.0 01/27/2019   RDW 13.3 01/27/2019   PLT 200 01/27/2019   Last metabolic panel Lab Results  Component Value Date   GLUCOSE 80 01/27/2019   NA 140 01/27/2019   K 4.7 01/27/2019   CL 99 01/27/2019  CO2 26 01/27/2019   BUN 15 01/27/2019   CREATININE 1.01 01/27/2019   GFRNONAA 88 01/27/2019   CALCIUM 9.8 01/27/2019   PROT 6.8 01/27/2019   ALBUMIN 4.6 01/27/2019   LABGLOB 2.2 01/27/2019   AGRATIO 2.1 01/27/2019   BILITOT 0.4 01/27/2019   ALKPHOS 98 01/27/2019   AST 26 01/27/2019   ALT 38 01/27/2019   Last lipids Lab Results  Component Value Date   CHOL 221 (H) 07/16/2016   HDL 43.20 07/16/2016   LDLCALC 150 (H) 07/16/2016   TRIG 138.0 07/16/2016   CHOLHDL 5 07/16/2016   Last hemoglobin A1c No results found for: "HGBA1C" Last thyroid functions Lab Results  Component Value Date   TSH 1.830 01/27/2019      Assessment & Plan:   Problem List Items Addressed This Visit       Cardiovascular and Mediastinum   Migraine headache without aura    Chronic, ongoing.  [place], continue this collaboration and medications as ordered by them.  Will attempt to get notes at next visit.      Relevant Medications   EMGALITY 120 MG/ML SOAJ   zonisamide (ZONEGRAN) 100 MG capsule   buPROPion (WELLBUTRIN XL) 150 MG 24 hr tablet     Digestive   Acid  reflux    Chronic, ongoing.  Continue Omeprazole daily and check Mag level annually.  Risks of PPI use were discussed with patient including bone loss, C. Diff diarrhea, pneumonia, infections, CKD, electrolyte abnormalities.  Verbalizes understanding and chooses to continue the medication.       Relevant Orders   Magnesium     Endocrine   IFG (impaired fasting glucose)    Noted on past labs, check CMP and A1c today.  Adjust plan as needed.      Relevant Orders   Comprehensive metabolic panel   HgB A1c     Other   Attention deficit hyperactivity disorder (ADHD)    Chronic, ongoing.  Followed by [facility] -- continue this collaboration and medications as ordered by them.  Attempt to get notes next visit.      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      Elevated low density lipoprotein (LDL) cholesterol level    Noted on past labs, check lipid panel today -- although may need to repeat as not fasting.      Relevant Orders   Comprehensive metabolic panel   Lipid Panel w/o Chol/HDL Ratio   Recurrent major depressive disorder, in full remission (HCC) - Primary    Chronic, ongoing.  Denies SI/HI.  Followed by [facility] -- continue this collaboration and medications as ordered by them.  Attempt to get notes next visit.      Relevant Medications   buPROPion (WELLBUTRIN XL) 150 MG 24 hr tablet   Other Relevant Orders   CBC with Differential/Platelet   TSH   Other Visit Diagnoses     Benign prostatic hyperplasia without lower urinary tract symptoms       PSA on labs today   Relevant Orders   PSA   Flu vaccine need       Flu vaccine in office today   Relevant Orders   Flu Vaccine QUAD 6+ mos PF IM (Fluarix Quad PF)   Encounter to establish care       Introduced to clinic setting and provider.       Return in about 6 months (around 07/19/2022) for MOOD  AND MIGRAINES.   Marjie Skiff, NP

## 2022-01-18 NOTE — Assessment & Plan Note (Signed)
Chronic, ongoing.  Denies SI/HI.  Followed by [facility] -- continue this collaboration and medications as ordered by them.  Attempt to get notes next visit.

## 2022-01-18 NOTE — Assessment & Plan Note (Signed)
Chronic, ongoing.  Continue Omeprazole daily and check Mag level annually.  Risks of PPI use were discussed with patient including bone loss, C. Diff diarrhea, pneumonia, infections, CKD, electrolyte abnormalities.  Verbalizes understanding and chooses to continue the medication.

## 2022-01-19 ENCOUNTER — Encounter: Payer: Self-pay | Admitting: Nurse Practitioner

## 2022-01-19 LAB — COMPREHENSIVE METABOLIC PANEL
ALT: 26 IU/L (ref 0–44)
AST: 23 IU/L (ref 0–40)
Albumin/Globulin Ratio: 1.8 (ref 1.2–2.2)
Albumin: 4.8 g/dL (ref 4.1–5.1)
Alkaline Phosphatase: 125 IU/L — ABNORMAL HIGH (ref 44–121)
BUN/Creatinine Ratio: 10 (ref 9–20)
BUN: 13 mg/dL (ref 6–24)
Bilirubin Total: 0.4 mg/dL (ref 0.0–1.2)
CO2: 24 mmol/L (ref 20–29)
Calcium: 9.7 mg/dL (ref 8.7–10.2)
Chloride: 106 mmol/L (ref 96–106)
Creatinine, Ser: 1.31 mg/dL — ABNORMAL HIGH (ref 0.76–1.27)
Globulin, Total: 2.6 g/dL (ref 1.5–4.5)
Glucose: 85 mg/dL (ref 70–99)
Potassium: 4.1 mmol/L (ref 3.5–5.2)
Sodium: 143 mmol/L (ref 134–144)
Total Protein: 7.4 g/dL (ref 6.0–8.5)
eGFR: 66 mL/min/{1.73_m2} (ref >59)

## 2022-01-19 LAB — CBC WITH DIFFERENTIAL/PLATELET
Basophils Absolute: 0 10*3/uL (ref 0.0–0.2)
Basos: 1 %
EOS (ABSOLUTE): 0.1 10*3/uL (ref 0.0–0.4)
Eos: 2 %
Hematocrit: 45.8 % (ref 37.5–51.0)
Hemoglobin: 15.5 g/dL (ref 13.0–17.7)
Immature Grans (Abs): 0 10*3/uL (ref 0.0–0.1)
Immature Granulocytes: 0 %
Lymphocytes Absolute: 1.7 10*3/uL (ref 0.7–3.1)
Lymphs: 29 %
MCH: 30 pg (ref 26.6–33.0)
MCHC: 33.8 g/dL (ref 31.5–35.7)
MCV: 89 fL (ref 79–97)
Monocytes Absolute: 0.6 10*3/uL (ref 0.1–0.9)
Monocytes: 11 %
Neutrophils Absolute: 3.3 10*3/uL (ref 1.4–7.0)
Neutrophils: 57 %
Platelets: 210 10*3/uL (ref 150–450)
RBC: 5.17 x10E6/uL (ref 4.14–5.80)
RDW: 12.7 % (ref 11.6–15.4)
WBC: 5.9 10*3/uL (ref 3.4–10.8)

## 2022-01-19 LAB — HEMOGLOBIN A1C
Est. average glucose Bld gHb Est-mCnc: 120 mg/dL
Hgb A1c MFr Bld: 5.8 % — ABNORMAL HIGH (ref 4.8–5.6)

## 2022-01-19 LAB — LIPID PANEL W/O CHOL/HDL RATIO
Cholesterol, Total: 172 mg/dL (ref 100–199)
HDL: 45 mg/dL (ref >39)
LDL Chol Calc (NIH): 101 mg/dL — ABNORMAL HIGH (ref 0–99)
Triglycerides: 146 mg/dL (ref 0–149)
VLDL Cholesterol Cal: 26 mg/dL (ref 5–40)

## 2022-01-19 LAB — PSA: Prostate Specific Ag, Serum: 0.9 ng/mL (ref 0.0–4.0)

## 2022-01-19 LAB — TSH: TSH: 2.62 u[IU]/mL (ref 0.450–4.500)

## 2022-01-19 LAB — MAGNESIUM: Magnesium: 2.3 mg/dL (ref 1.6–2.3)

## 2022-01-19 NOTE — Progress Notes (Signed)
Good morning, sent patient a lab result in [place] -- please call and ensure he is aware form for work is completed and he will be able to pick up on Tuesday once I am back in office:)

## 2022-01-19 NOTE — Progress Notes (Signed)
Contacted via MyChart The 10-year ASCVD risk score (Arnett DK, et al., 2019) is: 2.8%   Values used to calculate the score:     Age: 51 years     Sex: Male     Is Non-Hispanic African American: No     Diabetic: No     Tobacco smoker: No     Systolic Blood Pressure: 161 mmHg     Is BP treated: No     HDL Cholesterol: 45 mg/dL     Total Cholesterol: 172 mg/dL   Good morning Sofia, your labs have returned: - Kidney function, creatinine and eGFR, remains normal, as is liver function, AST and ALT.  - Thyroid, TSH, is normal. - Prostate lab, PSA, is normal. - Magnesium level and CBC show normal ranges. - A1c is showing a level in prediabetic range at 5.8%.  Any number 5.7 to 6.4 is considered prediabetes and any number 6.5 or greater is considered diabetes.   I would recommend heavy focus on decreasing foods high in sugar and your intake of things like bread products, pasta, and rice.  The American Diabetes Association online has a large amount of information on diet changes to make.  We will recheck this number in 6 months to ensure you are not continuing to trend upwards and move into diabetes.   - Your LDL is above normal. The LDL is the bad cholesterol. Over time and in combination with inflammation and other factors, this contributes to plaque which in turn may lead to stroke and/or heart attack down the road. Sometimes high LDL is primarily genetic, and people might be eating all the right foods but still have high numbers. Other times, there is room for improvement in one's diet and eating healthier can bring this number down and potentially reduce one's risk of heart attack and/or stroke.   To reduce your LDL, Remember - more fruits and vegetables, more fish, and limit red meat and dairy products. More soy, nuts, beans, barley, lentils, oats and plant sterol ester enriched margarine instead of butter. I also encourage eliminating sugar and processed food. Remember, shop on the  outside of the grocery store and visit your [employer]. If you would like to talk with me about dietary changes for your cholesterol, please let me know. We should recheck your cholesterol in 6 months.  Any questions? Keep being awesome!!  Thank you for allowing me to participate in your care.  I appreciate you. Kindest regards, Nikolette Reindl

## 2022-03-09 ENCOUNTER — Ambulatory Visit
Admission: RE | Admit: 2022-03-09 | Discharge: 2022-03-09 | Disposition: A | Discharge status: Home / Self Care | Payer: 59 | Source: Ambulatory Visit | Attending: Nurse Practitioner | Admitting: Nurse Practitioner

## 2022-03-09 ENCOUNTER — Ambulatory Visit
Admission: RE | Admit: 2022-03-09 | Discharge: 2022-03-09 | Disposition: A | Discharge status: Home / Self Care | Payer: 59 | Attending: Nurse Practitioner | Admitting: Nurse Practitioner

## 2022-03-09 ENCOUNTER — Ambulatory Visit: Payer: Self-pay

## 2022-03-09 ENCOUNTER — Ambulatory Visit: Payer: 59 | Admitting: Nurse Practitioner

## 2022-03-09 ENCOUNTER — Encounter: Payer: Self-pay | Admitting: Nurse Practitioner

## 2022-03-09 VITALS — BP 115/80 | HR 87 | Temp 98.6°F | Wt 180.7 lb

## 2022-03-09 DIAGNOSIS — M25531 Pain in right wrist: Secondary | ICD-10-CM

## 2022-03-09 DIAGNOSIS — M25532 Pain in left wrist: Secondary | ICD-10-CM | POA: Diagnosis present

## 2022-03-09 NOTE — Telephone Encounter (Signed)
   Chief Complaint: Larey Seat, right wrist injury Symptoms: Pain Frequency: Last night while skating Pertinent Negatives: Patient denies  Disposition: [] ED /[] Urgent Care (no appt availability in office) / [x] Appointment(In office/virtual)/ []  [facility]/ [] Home Care/ [] Refused Recommended Disposition /[] [facility] [facility]/ []  Follow-up with PCP Additional Notes:   Answer Assessment - Initial Assessment Questions 1. MECHANISM: "How did the fall happen?"     Fell 2. DOMESTIC VIOLENCE AND ELDER ABUSE SCREENING: "Did you fall because someone pushed you or tried to hurt you?" If Yes, ask: "Are you safe now?"     No 3. ONSET: "When did the fall happen?" (e.g., minutes, hours, or days ago)     Last night 4. LOCATION: "What part of the body hit the ground?" (e.g., back, buttocks, head, hips, knees, hands, head, stomach)     Fell back 5. INJURY: "Did you hurt (injure) yourself when you fell?" If Yes, ask: "What did you injure? Tell me more about this?" (e.g., body area; type of injury; pain severity)"     Right wrist 6. PAIN: "Is there any pain?" If Yes, ask: "How bad is the pain?" (e.g., Scale 1-10; or mild,  moderate, severe)   - NONE (0): No pain   - MILD (1-3): Doesn't interfere with normal activities    - MODERATE (4-7): Interferes with normal activities or awakens from sleep    - SEVERE (8-10): Excruciating pain, unable to do any normal activities      Now - severe 7. SIZE: For cuts, bruises, or swelling, ask: "How large is it?" (e.g., inches or centimeters)      No 8. PREGNANCY: "Is there any chance you are pregnant?" "When was your last menstrual period?"     N/a 9. OTHER SYMPTOMS: "Do you have any other symptoms?" (e.g., dizziness, fever, weakness; new onset or worsening).      No 10. CAUSE: "What do you think caused the fall (or falling)?" (e.g., tripped, dizzy spell)       Skating  Protocols used: Falls [facility]

## 2022-03-09 NOTE — Progress Notes (Signed)
BP 115/80   Pulse 87   Temp 98.6 F (37 C) (Oral)   Wt 180 lb 11.2 oz (82 kg)   SpO2 99%   BMI 24.51 kg/m    Subjective:    Patient ID: Douglas Landry, male    DOB: July 02, 1970, 51 y.o.   MRN: [id number]  HPI: Douglas Landry is a 51 y.o. male  Chief Complaint  Patient presents with   Wrist Pain   WRIST PAIN  Duration:  1 day Involved wrist: both sides Mechanism of injury:   fell while roller skating Location: diffuse Onset: sudden Severity: 7/10  Quality:  dull and aching Frequency: constant Radiation: no Aggravating factors:  turning his right wrist is very painful   Alleviating factors:NSAIDS Status: stable Treatments attempted:  naproxen     Relief with NSAIDs?:  mild Weakness: yes Numbness: no  na Redness: no Bruising: no Swelling: no Fevers: no  Relevant past medical, surgical, family and social history reviewed and updated as indicated. Interim medical history since our last visit reviewed. Allergies and medications reviewed and updated.  Review of Systems  Musculoskeletal:        Wrist pain- bilateral    Per HPI unless specifically indicated above     Objective:    BP 115/80   Pulse 87   Temp 98.6 F (37 C) (Oral)   Wt 180 lb 11.2 oz (82 kg)   SpO2 99%   BMI 24.51 kg/m   Wt Readings from Last 3 Encounters:  03/09/22 180 lb 11.2 oz (82 kg)  01/18/22 175 lb 4.8 oz (79.5 kg)  01/27/19 202 lb 8 oz (91.9 kg)    Physical Exam Vitals and nursing note reviewed.  Constitutional:      General: He is not in acute distress.    Appearance: Normal appearance. He is not ill-appearing, toxic-appearing or diaphoretic.  HENT:     Head: Normocephalic.     Right Ear: External ear normal.     Left Ear: External ear normal.     Nose: Nose normal. No congestion or rhinorrhea.     Mouth/Throat:     Mouth: Mucous membranes are moist.  Eyes:     General:        Right eye: No discharge.        Left eye: No discharge.     Extraocular Movements:  Extraocular movements intact.     Conjunctiva/sclera: Conjunctivae normal.     Pupils: Pupils are equal, round, and reactive to light.  Cardiovascular:     Rate and Rhythm: Normal rate and regular rhythm.     Heart sounds: No murmur heard. Pulmonary:     Effort: Pulmonary effort is normal. No respiratory distress.     Breath sounds: Normal breath sounds. No wheezing, rhonchi or rales.  Abdominal:     General: Abdomen is flat. Bowel sounds are normal.  Musculoskeletal:     Right wrist: Tenderness present. No swelling, bony tenderness or snuff box tenderness. Decreased range of motion.     Left wrist: Tenderness present. No swelling, bony tenderness or snuff box tenderness. Decreased range of motion.     Cervical back: Normal range of motion and neck supple.  Skin:    General: Skin is warm and dry.     Capillary Refill: Capillary refill takes less than 2 seconds.  Neurological:     General: No focal deficit present.     Mental Status: He is alert and oriented to person, place, and time.  Psychiatric:        Mood and Affect: Mood normal.        Behavior: Behavior normal.        Thought Content: Thought content normal.        Judgment: Judgment normal.     Results for orders placed or performed in visit on 01/18/22  CBC with Differential/Platelet  Result Value Ref Range   WBC 5.9 3.4 - 10.8 x10E3/uL   RBC 5.17 4.14 - 5.80 x10E6/uL   Hemoglobin 15.5 13.0 - 17.7 g/dL   Hematocrit 45.8 37.5 - 51.0 %   MCV 89 79 - 97 fL   MCH 30.0 26.6 - 33.0 pg   MCHC 33.8 31.5 - 35.7 g/dL   RDW 12.7 11.6 - 15.4 %   Platelets 210 150 - 450 x10E3/uL   Neutrophils 57 Not Estab. %   Lymphs 29 Not Estab. %   Monocytes 11 Not Estab. %   Eos 2 Not Estab. %   Basos 1 Not Estab. %   Neutrophils Absolute 3.3 1.4 - 7.0 x10E3/uL   Lymphocytes Absolute 1.7 0.7 - 3.1 x10E3/uL   Monocytes Absolute 0.6 0.1 - 0.9 x10E3/uL   EOS (ABSOLUTE) 0.1 0.0 - 0.4 x10E3/uL   Basophils Absolute 0.0 0.0 - 0.2 x10E3/uL    Immature Granulocytes 0 Not Estab. %   Immature Grans (Abs) 0.0 0.0 - 0.1 x10E3/uL  Comprehensive metabolic panel  Result Value Ref Range   Glucose 85 70 - 99 mg/dL   BUN 13 6 - 24 mg/dL   Creatinine, Ser 1.31 (H) 0.76 - 1.27 mg/dL   eGFR 66 >59 mL/min/1.73   BUN/Creatinine Ratio 10 9 - 20   Sodium 143 134 - 144 mmol/L   Potassium 4.1 3.5 - 5.2 mmol/L   Chloride 106 96 - 106 mmol/L   CO2 24 20 - 29 mmol/L   Calcium 9.7 8.7 - 10.2 mg/dL   Total Protein 7.4 6.0 - 8.5 g/dL   Albumin 4.8 4.1 - 5.1 g/dL   Globulin, Total 2.6 1.5 - 4.5 g/dL   Albumin/Globulin Ratio 1.8 1.2 - 2.2   Bilirubin Total 0.4 0.0 - 1.2 mg/dL   Alkaline Phosphatase 125 (H) 44 - 121 IU/L   AST 23 0 - 40 IU/L   ALT 26 0 - 44 IU/L  Lipid Panel w/o Chol/HDL Ratio  Result Value Ref Range   Cholesterol, Total 172 100 - 199 mg/dL   Triglycerides 146 0 - 149 mg/dL   HDL 45 >39 mg/dL   VLDL Cholesterol Cal 26 5 - 40 mg/dL   LDL Chol Calc (NIH) 101 (H) 0 - 99 mg/dL  TSH  Result Value Ref Range   TSH 2.620 0.450 - 4.500 uIU/mL  PSA  Result Value Ref Range   Prostate Specific Ag, Serum 0.9 0.0 - 4.0 ng/mL  Magnesium  Result Value Ref Range   Magnesium 2.3 1.6 - 2.3 mg/dL  HgB A1c  Result Value Ref Range   Hgb A1c MFr Bld 5.8 (H) 4.8 - 5.6 %   Est. average glucose Bld gHb Est-mCnc 120 mg/dL      Assessment & Plan:   Problem List Items Addressed This Visit   None Visit Diagnoses     Pain in both wrists    -  Primary   Will obtain xrayx of both wrists.  Recommend NSAIDS, Ice and rest.  Follow up if symptoms not improved.   Relevant Orders   DG Wrist Complete Left   DG  Wrist Complete Right        Follow up plan: Return if symptoms worsen or fail to improve.

## 2022-03-13 NOTE — Progress Notes (Signed)
Hi Kasen.  No fracture found on either xray.  I recommend resting, icing and using NSAIDS to help with the pain.

## 2022-03-13 NOTE — Progress Notes (Signed)
Hi Zubayr.  No fracture found on either xray.  I recommend resting, icing and using NSAIDS to help with the pain.

## 2022-07-25 ENCOUNTER — Ambulatory Visit: Payer: Managed Care, Other (non HMO) | Admitting: Nurse Practitioner

## 2022-08-05 NOTE — Patient Instructions (Signed)
Attention Deficit Hyperactivity Disorder, Adult Attention deficit hyperactivity disorder (ADHD) is a mental health disorder that starts during childhood. For many people with ADHD, the disorder continues into the adult years. Treatment can help you manage your symptoms. There are three main types of ADHD: Inattentive. With this type, adults have difficulty paying attention. This may affect cognitive abilities. Hyperactive-impulsive. With this type, adults have a lot of energy and have difficulty controlling their behavior. Combination type. Some people may have symptoms of both types. What are the causes? The exact cause of ADHD is not known. Most experts believe a person's genes and environment possibly contribute to ADHD. What increases the risk? The following factors may make you more likely to develop this condition: Having a first-degree relative such as a parent, brother, or sister, with the condition. Being born before 37 weeks of pregnancy (prematurely) or at a low birth weight. Being born to a mother who smoked tobacco or drank alcohol during pregnancy. Having experienced a brain injury. Being exposed to lead or other toxins in the womb or early in life. What are the signs or symptoms? Symptoms of this condition depend on the type of ADHD. Symptoms of the inattentive type include: Difficulty paying attention or following instructions. Often making simple mistakes. Being disorganized. Avoiding tasks that require time and attention. Losing and forgetting things. Symptoms of the hyperactive-impulsive type include: Restlessness. Talking out of turn, interrupting others, or talking too much. Difficulty with: Sitting still. Feeling motivated. Relaxing. Waiting in line or waiting for a turn. People with the combination type have symptoms of both of the other types. In adults, this condition may lead to certain problems, such as: Keeping jobs. Performing tasks at work. Having  stable relationships. Being on time or keeping to a schedule. How is this diagnosed? This condition is diagnosed based on your current symptoms and your history of symptoms. The diagnosis can be made by a health care provider such as a primary care provider or a mental health care specialist. Your health care provider may use a symptom checklist or a behavior rating scale to evaluate your symptoms. Your health care provider may also want to talk with people who have observed your behaviors throughout your life. How is this treated? This condition can be treated with medicines and behavior therapy. Medicines may be the best option to reduce impulsive behaviors and improve attention. Your health care provider may recommend: Stimulant medicines. These are the most common medicines used for adult ADHD. They affect certain chemicals in the brain (neurotransmitters) and improve your ability to control your symptoms. A non-stimulant medicine. These medicines can also improve focus, attention, and impulsive behavior. It may take weeks to months to see the effects of this medicine. Counseling and behavioral management are also important for treating ADHD. Counseling is often used along with medicine. Your health care provider may suggest: Cognitive behavioral therapy (CBT). This type of therapy teaches you to replace negative thoughts and actions with positive thoughts and actions. When used as part of ADHD treatment, this therapy may also include: Coping strategies for organization, time management, impulse control, and stress reduction. Mindfulness and meditation training. Behavioral management. You may work with a coach who is specially trained to help people with ADHD manage and organize activities and function more effectively. Follow these instructions at home: Medicines  Take over-the-counter and prescription medicines only as told by your health care provider. Talk with your health care provider  about the possible side effects of your medicines and   how to manage them. Alcohol use Do not drink alcohol if: Your health care provider tells you not to drink. You are pregnant, may be pregnant, or are planning to become pregnant. If you drink alcohol: Limit how much you use to: 0-1 drink a day for women. 0-2 drinks a day for men. Know how much alcohol is in your drink. In the [place] one drink equals one 12 oz bottle of beer (355 mL), one 5 oz glass of wine (148 mL), or one 1 oz glass of hard liquor (44 mL). Lifestyle  Do not use illegal drugs. Get enough sleep. Eat a healthy diet. Exercise regularly. Exercise can help to reduce stress and anxiety. General instructions Learn as much as you can about adult ADHD, and work closely with your health care providers to find the treatments that work best for you. Follow the same schedule each day. Use reminder devices like notes, calendars, and phone apps to stay on time and organized. Keep all follow-up visits. Your health care provider will need to monitor your condition and adjust your treatment over time. Where to find more information A health care provider may be able to recommend resources that are available online or over the phone. You could start with: Attention Deficit Disorder Association (ADDA): add.org National Institute of Mental Health (NIMH): nimh.nih.gov Contact a health care provider if: Your symptoms continue to cause problems. You have side effects from your medicine, such as: Repeated muscle twitches, coughing, or speech outbursts. Sleep problems. Loss of appetite. Dizziness. Unusually fast heartbeat. Stomach pains. Headaches. You are struggling with anxiety, depression, or substance abuse. Get help right away if: You have a severe reaction to a medicine. This symptom may be an emergency. Get help right away. Call 911. Do not wait to see if the symptom will go away. Do not drive yourself to the hospital. Take  one of these steps if you feel like you may hurt yourself or others, or have thoughts about taking your own life: Go to your nearest emergency room. Call 911. Call the National Suicide Prevention Lifeline at 1-800-273-8255 or 988. This is open 24 hours a day Text the Crisis Text Line at 741741. Summary ADHD is a mental health disorder that starts during childhood and often continues into your adult years. The exact cause of ADHD is not known. Most experts believe genetics and environmental factors contribute to ADHD. There is no cure for ADHD, but treatment with medicine, cognitive behavioral therapy, or behavioral management can help you manage your condition. This information is not intended to replace advice given to you by your health care provider. Make sure you discuss any questions you have with your health care provider. Document Revised: 06/23/2021 Document Reviewed: 06/23/2021 Elsevier Patient Education  2023 Elsevier Inc.  

## 2022-08-10 ENCOUNTER — Ambulatory Visit (INDEPENDENT_AMBULATORY_CARE_PROVIDER_SITE_OTHER): Payer: Managed Care, Other (non HMO) | Admitting: Nurse Practitioner

## 2022-08-10 ENCOUNTER — Encounter: Payer: Self-pay | Admitting: Nurse Practitioner

## 2022-08-10 VITALS — BP 115/79 | HR 102 | Temp 97.7°F | Ht 72.01 in | Wt 187.6 lb

## 2022-08-10 DIAGNOSIS — F3342 Major depressive disorder, recurrent, in full remission: Secondary | ICD-10-CM

## 2022-08-10 DIAGNOSIS — G43009 Migraine without aura, not intractable, without status migrainosus: Secondary | ICD-10-CM

## 2022-08-10 DIAGNOSIS — F900 Attention-deficit hyperactivity disorder, predominantly inattentive type: Secondary | ICD-10-CM | POA: Diagnosis not present

## 2022-08-10 NOTE — Assessment & Plan Note (Signed)
Chronic, ongoing.  Followed by [facility] -- continue this collaboration and medications as ordered by them.  Attempt to get notes next visit. 

## 2022-08-10 NOTE — Assessment & Plan Note (Signed)
Chronic, ongoing.  Denies SI/HI.  Followed by [facility] -- continue this collaboration and medications as ordered by them.  Attempt to get notes next visit. 

## 2022-08-10 NOTE — Assessment & Plan Note (Signed)
Chronic, ongoing.  Followed by headache clinic in [place], continue this collaboration and medications as ordered by them.  Will attempt to get notes from clinic, patient may need to sign release of records at next visit.

## 2022-08-10 NOTE — Progress Notes (Signed)
BP 115/79   Pulse (!) 102   Temp 97.7 F (36.5 C) (Oral)   Ht 6' 0.01" (1.829 m)   Wt 187 lb 9.6 oz (85.1 kg)   SpO2 96%   BMI 25.44 kg/m    Subjective:    Patient ID: Douglas Landry, male    DOB: 12-12-1970, 52 y.o.   MRN: [id number]  HPI: Douglas Landry is a 52 y.o. male  Chief Complaint  Patient presents with   Migraine   Mood   MIGRAINES Currently followed by headache clinic in [place] and they are not on Epic.  Taking Emgality & Zonegran + obtains injections in office every 6 weeks from headache provider (not Botox).  On review has taken Aimovig, Propranolol, and Imitrex in past.   Duration: chronic Onset: gradual Severity: 10/10 Quality: dull, aching, and throbbing Frequency: intermittent Location: starts around neck and into anterior head, occasionally to one area Headache duration: 24 hours  Radiation: no Time of day headache occurs: often strike in middle of night around 3 am Alleviating factors: Injections and medications Aggravating factors: chocolate, caffeine absence  Headache status at time of visit: asymptomatic Treatments attempted: Aimovig, Propranolol, and Imitrex Aura: no -- in past Nausea:  yes Vomiting: yes Photophobia:  no Phonophobia: improvement with Zonegran Effect on social functioning:  no Numbers of missed days of school/work each month: 0 Confusion:  no Gait disturbance/ataxia:  no Behavioral changes:  no Fevers:  no    DEPRESSION & ADHD Followed by [facility] for psychiatry.  Currently taking Wellbutrin.  Insurance would not cover Adderall, this medication was helping him.  Recently took on care of a niece at home + has some financial pressures. Mood status: stable Satisfied with current treatment?: yes Symptom severity: moderate  Duration of current treatment : chronic Side effects: no Medication compliance: good compliance Psychotherapy/counseling: none Previous psychiatric medications: multiple medications Depressed mood:  a little Anxious mood: yes Anhedonia: yes Significant weight loss or gain: no Insomnia: yes hard to fall asleep Fatigue:  occasional  Feelings of worthlessness or guilt: no Impaired concentration/indecisiveness: yes Suicidal ideations: no Hopelessness: no Crying spells: no    08/10/2022   10:59 AM 01/18/2022    8:48 AM 06/06/2016    3:57 PM  Depression screen PHQ 2/9  Decreased Interest 1 1 0  Down, Depressed, Hopeless 1 0 0  PHQ - 2 Score 2 1 0  Altered sleeping 1 2   Tired, decreased energy 1 2   Change in appetite 0 0   Feeling bad or failure about yourself  3 3   Trouble concentrating 2 3   Moving slowly or fidgety/restless 0 1   Suicidal thoughts 0 0   PHQ-9 Score 9 12   Difficult doing work/chores Very difficult Somewhat difficult        08/10/2022   11:00 AM 01/18/2022    8:51 AM  GAD 7 : Generalized Anxiety Score  Nervous, Anxious, on Edge 3 3  Control/stop worrying 3 0  Worry too much - different things 2 0  Trouble relaxing 3 1  Restless 0 1  Easily annoyed or irritable 1 1  Afraid - awful might happen 3 1  Total GAD 7 Score 15 7  Anxiety Difficulty Somewhat difficult Somewhat difficult   Relevant past medical, surgical, family and social history reviewed and updated as indicated. Interim medical history since our last visit reviewed. Allergies and medications reviewed and updated.  Review of Systems  Constitutional:  Negative for activity  change, diaphoresis, fatigue and fever.  Respiratory:  Negative for cough, chest tightness, shortness of breath and wheezing.   Cardiovascular:  Negative for chest pain, palpitations and leg swelling.  Gastrointestinal: Negative.   Neurological:  Positive for headaches. Negative for dizziness, tremors and weakness.  Psychiatric/Behavioral: Negative.      Per HPI unless specifically indicated above     Objective:    BP 115/79   Pulse (!) 102   Temp 97.7 F (36.5 C) (Oral)   Ht 6' 0.01" (1.829 m)   Wt 187 lb 9.6  oz (85.1 kg)   SpO2 96%   BMI 25.44 kg/m   Wt Readings from Last 3 Encounters:  08/10/22 187 lb 9.6 oz (85.1 kg)  03/09/22 180 lb 11.2 oz (82 kg)  01/18/22 175 lb 4.8 oz (79.5 kg)    Physical Exam Vitals and nursing note reviewed.  Constitutional:      General: He is awake. He is not in acute distress.    Appearance: He is well-developed and well-groomed. He is not ill-appearing or toxic-appearing.  HENT:     Head: Normocephalic and atraumatic.     Right Ear: Hearing and external ear normal.     Left Ear: Hearing and external ear normal.  Eyes:     General: Lids are normal.        Right eye: No discharge.        Left eye: No discharge.     Conjunctiva/sclera: Conjunctivae normal.     Pupils: Pupils are equal, round, and reactive to light.  Neck:     Thyroid: No thyromegaly.     Vascular: No carotid bruit.     Trachea: Trachea normal.  Cardiovascular:     Rate and Rhythm: Normal rate and regular rhythm.     Heart sounds: Normal heart sounds, S1 normal and S2 normal. No murmur heard.    No gallop.  Pulmonary:     Effort: Pulmonary effort is normal. No accessory muscle usage or respiratory distress.     Breath sounds: Normal breath sounds.  Abdominal:     General: Bowel sounds are normal. There is no distension.     Palpations: Abdomen is soft.     Tenderness: There is no abdominal tenderness.  Musculoskeletal:        General: Normal range of motion.     Cervical back: Normal range of motion and neck supple.     Right lower leg: No edema.     Left lower leg: No edema.  Lymphadenopathy:     Cervical: No cervical adenopathy.  Skin:    General: Skin is warm and dry.     Capillary Refill: Capillary refill takes less than 2 seconds.  Neurological:     Mental Status: He is alert and oriented to person, place, and time.     Gait: Gait is intact.     Deep Tendon Reflexes: Reflexes are normal and symmetric.     Reflex Scores:      Brachioradialis reflexes are 2+ on the right  side and 2+ on the left side.      Patellar reflexes are 2+ on the right side and 2+ on the left side. Psychiatric:        Attention and Perception: Attention normal.        Mood and Affect: Mood normal.        Speech: Speech normal.        Behavior: Behavior normal. Behavior is cooperative.  Thought Content: Thought content normal.        Cognition and Memory: Cognition normal.    Results for orders placed or performed in visit on 01/18/22  CBC with Differential/Platelet  Result Value Ref Range   WBC 5.9 3.4 - 10.8 x10E3/uL   RBC 5.17 4.14 - 5.80 x10E6/uL   Hemoglobin 15.5 13.0 - 17.7 g/dL   Hematocrit 40.9 81.1 - 51.0 %   MCV 89 79 - 97 fL   MCH 30.0 26.6 - 33.0 pg   MCHC 33.8 31.5 - 35.7 g/dL   RDW 91.4 78.2 - 95.6 %   Platelets 210 150 - 450 x10E3/uL   Neutrophils 57 Not Estab. %   Lymphs 29 Not Estab. %   Monocytes 11 Not Estab. %   Eos 2 Not Estab. %   Basos 1 Not Estab. %   Neutrophils Absolute 3.3 1.4 - 7.0 x10E3/uL   Lymphocytes Absolute 1.7 0.7 - 3.1 x10E3/uL   Monocytes Absolute 0.6 0.1 - 0.9 x10E3/uL   EOS (ABSOLUTE) 0.1 0.0 - 0.4 x10E3/uL   Basophils Absolute 0.0 0.0 - 0.2 x10E3/uL   Immature Granulocytes 0 Not Estab. %   Immature Grans (Abs) 0.0 0.0 - 0.1 x10E3/uL  Comprehensive metabolic panel  Result Value Ref Range   Glucose 85 70 - 99 mg/dL   BUN 13 6 - 24 mg/dL   Creatinine, Ser 2.13 (H) 0.76 - 1.27 mg/dL   eGFR 66 >08 MV/HQI/6.96   BUN/Creatinine Ratio 10 9 - 20   Sodium 143 134 - 144 mmol/L   Potassium 4.1 3.5 - 5.2 mmol/L   Chloride 106 96 - 106 mmol/L   CO2 24 20 - 29 mmol/L   Calcium 9.7 8.7 - 10.2 mg/dL   Total Protein 7.4 6.0 - 8.5 g/dL   Albumin 4.8 4.1 - 5.1 g/dL   Globulin, Total 2.6 1.5 - 4.5 g/dL   Albumin/Globulin Ratio 1.8 1.2 - 2.2   Bilirubin Total 0.4 0.0 - 1.2 mg/dL   Alkaline Phosphatase 125 (H) 44 - 121 IU/L   AST 23 0 - 40 IU/L   ALT 26 0 - 44 IU/L  Lipid Panel w/o Chol/HDL Ratio  Result Value Ref Range    Cholesterol, Total 172 100 - 199 mg/dL   Triglycerides 295 0 - 149 mg/dL   HDL 45 >28 mg/dL   VLDL Cholesterol Cal 26 5 - 40 mg/dL   LDL Chol Calc (NIH) 413 (H) 0 - 99 mg/dL  TSH  Result Value Ref Range   TSH 2.620 0.450 - 4.500 uIU/mL  PSA  Result Value Ref Range   Prostate Specific Ag, Serum 0.9 0.0 - 4.0 ng/mL  Magnesium  Result Value Ref Range   Magnesium 2.3 1.6 - 2.3 mg/dL  HgB K4M  Result Value Ref Range   Hgb A1c MFr Bld 5.8 (H) 4.8 - 5.6 %   Est. average glucose Bld gHb Est-mCnc 120 mg/dL      Assessment & Plan:   Problem List Items Addressed This Visit       Cardiovascular and Mediastinum   Migraine headache without aura (Chronic)    Chronic, ongoing.  Followed by headache clinic in [place], continue this collaboration and medications as ordered by them.  Will attempt to get notes from clinic, patient may need to sign release of records at next visit.        Other   Attention deficit hyperactivity disorder (ADHD) (Chronic)    Chronic, ongoing.  Followed by [facility] --  continue this collaboration and medications as ordered by them.  Attempt to get notes next visit.      Recurrent major depressive disorder, in full remission (HCC) - Primary (Chronic)    Chronic, ongoing.  Denies SI/HI.  Followed by [facility] -- continue this collaboration and medications as ordered by them.  Attempt to get notes next visit.        Follow up plan: Return in about 3 months (around 11/21/2022) for Annual physical.

## 2022-11-10 NOTE — Patient Instructions (Signed)
Managing Depression, Adult Depression is a mental health condition that affects your thoughts, feelings, and actions. Being diagnosed with depression can bring you relief if you did not know why you have felt or behaved a certain way. It could also leave you feeling overwhelmed. Finding ways to manage your symptoms can help you feel more positive about your future. How to manage lifestyle changes Being depressed is difficult. Depression can increase the level of everyday stress. Stress can make depression symptoms worse. You may believe your symptoms cannot be managed or will never improve. However, there are many things you can try to help manage your symptoms. There is hope. Managing stress  Stress is your body's reaction to life changes and events, both good and bad. Stress can add to your feelings of depression. Learning to manage your stress can help lessen your feelings of depression. Try some of the following approaches to reducing your stress (stress reduction techniques): Listen to music that you enjoy and that inspires you. Try using a meditation app or take a meditation class. Develop a practice that helps you connect with your spiritual self. Walk in nature, pray, or go to a place of worship. Practice deep breathing. To do this, inhale slowly through your nose. Pause at the top of your inhale for a few seconds and then exhale slowly, letting yourself relax. Repeat this three or four times. Practice yoga to help relax and work your muscles. Choose a stress reduction technique that works for you. These techniques take time and practice to develop. Set aside 5-15 minutes a day to do them. Therapists can offer training in these techniques. Do these things to help manage stress: Keep a journal. Know your limits. Set healthy boundaries for yourself and others, such as saying "no" when you think something is too much. Pay attention to how you react to certain situations. You may not be able to  control everything, but you can change your reaction. Add humor to your life by watching funny movies or shows. Make time for activities that you enjoy and that relax you. Spend less time using electronics, especially at night before bed. The light from screens can make your brain think it is time to get up rather than go to bed.  Medicines Medicines, such as antidepressants, are often a part of treatment for depression. Talk with your pharmacist or health care provider about all the medicines, supplements, and herbal products that you take, their possible side effects, and what medicines and other products are safe to take together. Make sure to report any side effects you may have to your health care provider. Relationships Your health care provider may suggest family therapy, couples therapy, or individual therapy as part of your treatment. How to recognize changes Everyone responds differently to treatment for depression. As you recover from depression, you may start to: Have more interest in doing activities. Feel more hopeful. Have more energy. Eat a more regular amount of food. Have better mental focus. It is important to recognize if your depression is not getting better or is getting worse. The symptoms you had in the beginning may return, such as: Feeling tired. Eating too much or too little. Sleeping too much or too little. Feeling restless, agitated, or hopeless. Trouble focusing or making decisions. Having unexplained aches and pains. Feeling irritable, angry, or aggressive. If you or your family members notice these symptoms coming back, let your health care provider know right away. Follow these instructions at home: Activity Try to   get some form of exercise each day, such as walking. Try yoga, mindfulness, or other stress reduction techniques. Participate in group activities if you are able. Lifestyle Get enough sleep. Cut down on or stop using caffeine, tobacco,  alcohol, and any other harmful substances. Eat a healthy diet that includes plenty of vegetables, fruits, whole grains, low-fat dairy products, and lean protein. Limit foods that are high in solid fats, added sugar, or salt (sodium). General instructions Take over-the-counter and prescription medicines only as told by your health care provider. Keep all follow-up visits. It is important for your health care provider to check on your mood, behavior, and medicines. Your health care provider may need to make changes to your treatment. Where to find support Talking to others  Friends and family members can be sources of support and guidance. Talk to trusted friends or family members about your condition. Explain your symptoms and let them know that you are working with a health care provider to treat your depression. Tell friends and family how they can help. Finances Find mental health providers that fit with your financial situation. Talk with your health care provider if you are worried about access to food, housing, or medicine. Call your insurance company to learn about your co-pays and prescription plan. Where to find more information You can find support in your area from: Anxiety and Depression Association of [place] (ADAA): adaa.org Mental Health [place]: mentalhealthamerica.net National Alliance on Mental Illness: nami.org Contact a health care provider if: You stop taking your antidepressant medicines, and you have any of these symptoms: Nausea. Headache. Light-headedness. Chills and body aches. Not being able to sleep (insomnia). You or your friends and family think your depression is getting worse. Get help right away if: You have thoughts of hurting yourself or others. Get help right away if you feel like you may hurt yourself or others, or have thoughts about taking your own life. Go to your nearest emergency room or: Call 911. Call the National Suicide Prevention Lifeline at  1-800-273-8255 or 988. This is open 24 hours a day. Text the Crisis Text Line at 741741. This information is not intended to replace advice given to you by your health care provider. Make sure you discuss any questions you have with your health care provider. Document Revised: 07/11/2021 Document Reviewed: 07/11/2021 [employer] Patient Education  2024 [employer]  

## 2022-11-13 ENCOUNTER — Encounter: Payer: Self-pay | Admitting: Nurse Practitioner

## 2022-11-13 ENCOUNTER — Ambulatory Visit (INDEPENDENT_AMBULATORY_CARE_PROVIDER_SITE_OTHER): Payer: Managed Care, Other (non HMO) | Admitting: Nurse Practitioner

## 2022-11-13 VITALS — BP 122/82 | HR 99 | Temp 97.5°F | Ht 72.5 in | Wt 174.2 lb

## 2022-11-13 DIAGNOSIS — Z23 Encounter for immunization: Secondary | ICD-10-CM | POA: Diagnosis not present

## 2022-11-13 DIAGNOSIS — F900 Attention-deficit hyperactivity disorder, predominantly inattentive type: Secondary | ICD-10-CM

## 2022-11-13 DIAGNOSIS — G43009 Migraine without aura, not intractable, without status migrainosus: Secondary | ICD-10-CM

## 2022-11-13 DIAGNOSIS — Z0000 Encounter for general adult medical examination without abnormal findings: Secondary | ICD-10-CM

## 2022-11-13 DIAGNOSIS — E7800 Pure hypercholesterolemia, unspecified: Secondary | ICD-10-CM

## 2022-11-13 DIAGNOSIS — H5440 Blindness, one eye, unspecified eye: Secondary | ICD-10-CM

## 2022-11-13 DIAGNOSIS — N400 Enlarged prostate without lower urinary tract symptoms: Secondary | ICD-10-CM

## 2022-11-13 DIAGNOSIS — K219 Gastro-esophageal reflux disease without esophagitis: Secondary | ICD-10-CM

## 2022-11-13 DIAGNOSIS — F3342 Major depressive disorder, recurrent, in full remission: Secondary | ICD-10-CM | POA: Diagnosis not present

## 2022-11-13 DIAGNOSIS — R7309 Other abnormal glucose: Secondary | ICD-10-CM

## 2022-11-13 NOTE — Progress Notes (Signed)
BP 122/82   Pulse 99   Temp (!) 97.5 F (36.4 C)   Ht 6' 0.5" (1.842 m)   Wt 174 lb 3.2 oz (79 kg)   SpO2 100%   BMI 23.30 kg/m    Subjective:    Patient ID: Douglas Landry, male    DOB: 1970-04-24, 52 y.o.   MRN: [id number]  HPI: Douglas Landry is a 52 y.o. male presenting on 11/13/2022 for comprehensive medical examination. Current medical complaints include:none  He currently lives with: Interim Problems from his last visit: no  The 10-year ASCVD risk score (Arnett DK, et al., 2019) is: 3.2%   Values used to calculate the score:     Age: 56 years     Sex: Male     Is Non-Hispanic African American: No     Diabetic: No     Tobacco smoker: No     Systolic Blood Pressure: 122 mmHg     Is BP treated: No     HDL Cholesterol: 45 mg/dL     Total Cholesterol: 172 mg/dL  Has history of elevation of A1c November 2023 at 5.8%, denies any symptoms.  Continues on Omeprazole for heart burn with benefit.    MIGRAINES Followed by headache clinic in [place], notes not in Epic.  Taking Emgality & Zonegran + obtains injections in office every 6 weeks from headache provider (not Botox, but alternate injections).  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  On review has taken Aimovig, Propranolol, and Imitrex in past.   Duration: chronic Onset: gradual Severity: 10/10 Quality: dull, aching, and throbbing Frequency: intermittent Location: starts around neck and into anterior head Headache duration: 6 to 24 hours  Radiation: no Time of day headache occurs: strike in middle of night around 3 am Alleviating factors: Injections and medications Aggravating factors: chocolate, caffeine absence  Headache status at time of visit: constantly has mild headache Treatments attempted: Aimovig, Propranolol, and Imitrex Aura: no  Nausea:  no Vomiting: no Photophobia:   no Phonophobia: no Effect on social functioning:  no Numbers of missed days of school/work each month: 0 Confusion:  no Gait disturbance/ataxia:  no Behavioral changes:  no Fevers:  no    DEPRESSION & ADHD Followed by [facility] for psychiatry.  Currently taking Wellbutrin.  Insurance will not cover Adderall, this medication was helping him.  Continues to have stressors with niece living in home and financial stressors.  Mood status: stable Satisfied with current treatment?: yes Symptom severity: moderate  Duration of current treatment : chronic Side effects: no Medication compliance: good compliance Psychotherapy/counseling: none Previous psychiatric medications: multiple medications Depressed mood: yes, sometimes Anxious mood: yes, sometimes Anhedonia: yes, sometimes Significant weight loss or gain: no Insomnia: yes hard to fall asleep Fatigue:  occasional  Feelings of worthlessness or guilt: no Impaired concentration/indecisiveness: yes Suicidal ideations: no Hopelessness: no Crying spells: no    11/13/2022    8:48 AM 08/10/2022   10:59 AM 01/18/2022    8:48 AM 06/06/2016    3:57 PM  Depression screen PHQ 2/9  Decreased Interest 1 1 1  0  Down, Depressed, Hopeless 1 1 0 0  PHQ - 2 Score 2 2 1  0  Altered sleeping 1 1 2    Tired, decreased energy 1 1 2    Change in appetite 0 0 0   Feeling bad or failure  about yourself  2 3 3    Trouble concentrating 2 2 3    Moving slowly or fidgety/restless 1 0 1   Suicidal thoughts 0 0 0   PHQ-9 Score 9 9 12    Difficult doing work/chores Somewhat difficult Very difficult Somewhat difficult        11/13/2022    8:49 AM 08/10/2022   11:00 AM 01/18/2022    8:51 AM  GAD 7 : Generalized Anxiety Score  Nervous, Anxious, on Edge 2 3 3   Control/stop worrying 1 3 0  Worry too much - different things 1 2 0  Trouble relaxing 3 3 1   Restless 1 0 1  Easily annoyed or irritable 1 1 1   Afraid - awful might happen 2 3 1   Total GAD 7 Score  11 15 7   Anxiety Difficulty  Somewhat difficult Somewhat difficult   Functional Status Survey: Is the patient deaf or have difficulty hearing?: No Does the patient have difficulty seeing, even when wearing glasses/contacts?: No Does the patient have difficulty concentrating, remembering, or making decisions?: No Does the patient have difficulty walking or climbing stairs?: No Does the patient have difficulty dressing or bathing?: No Does the patient have difficulty doing errands alone such as visiting a doctor's office or shopping?: No  FALL RISK:    11/13/2022    8:48 AM 01/18/2022    8:53 AM  Fall Risk   Falls in the past year? 0 0  Number falls in past yr: 0 0  Injury with Fall? 0 0  Risk for fall due to : No Fall Risks No Fall Risks  Follow up Falls evaluation completed Education provided   Past Medical History:  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   Chicken pox    Depression    Frequent headaches    GERD (gastroesophageal reflux disease)    Migraines     Surgical History:  Past Surgical History:  Procedure Laterality Date   CATARACT EXTRACTION     right eye as an infant - blind in right eye   EYE SURGERY     lens trasplant right eye [phone]   FRACTURE SURGERY Left    left hand fracture reset 2017   WISDOM TOOTH EXTRACTION      Medications:  Current Outpatient Medications on File Prior to Visit  Medication Sig   buPROPion (WELLBUTRIN XL) 150 MG 24 hr tablet Take 150 mg by mouth every morning.   buPROPion (WELLBUTRIN XL) 300 MG 24 hr tablet Take 1 tablet (300 mg total) by mouth daily.   EMGALITY 120 MG/ML SOAJ Inject into the skin.   omeprazole (PRILOSEC) 20 MG capsule Take 1 capsule (20 mg total) by mouth daily.   zonisamide (ZONEGRAN) 100 MG capsule Take 300 mg by mouth daily.   No current facility-administered medications on file prior to  visit.    Allergies:  No Known Allergies  Social History:  Social History   Socioeconomic History   Marital status: Married    Spouse name: Not on file   Number of children: 3   Years of education: Not on file   Highest education level: Not on file  Occupational History   Occupation: IT specialist  Tobacco Use   Smoking status: Never   Smokeless tobacco: Never  Vaping Use  Vaping status: Never Used  Substance and Sexual Activity   Alcohol use: No   Drug use: No   Sexual activity: Yes    Partners: Female  Other Topics Concern   Not on file  Social History Narrative   3 daughters   Social Determinants of Health   Financial Resource Strain: Medium Risk (01/18/2022)   Overall Financial Resource Strain (CARDIA)    Difficulty of Paying Living Expenses: Somewhat hard  Food Insecurity: No Food Insecurity (01/18/2022)   Hunger Vital Sign    Worried About Running Out of Food in the Last Year: Never true    Ran Out of Food in the Last Year: Never true  Transportation Needs: No Transportation Needs (01/18/2022)   PRAPARE - Administrator, Civil Service (Medical): No    Lack of Transportation (Non-Medical): No  Physical Activity: Inactive (01/18/2022)   Exercise Vital Sign    Days of Exercise per Week: 0 days    Minutes of Exercise per Session: 0 min  Stress: Stress Concern Present (01/18/2022)   [employer] of Occupational Health - Occupational Stress Questionnaire    Feeling of Stress : Rather much  Social Connections: Moderately Integrated (01/18/2022)   Social Connection and Isolation Panel [NHANES]    Frequency of Communication with Friends and Family: More than three times a week    Frequency of Social Gatherings with Friends and Family: More than three times a week    Attends Religious Services: More than 4 times per year    Active Member of [employer] or Organizations: No    Attends Banker Meetings: Never    Marital Status: Married  Careers information officer Violence: Not At Risk (01/18/2022)   Humiliation, Afraid, Rape, and Kick questionnaire    Fear of Current or Ex-Partner: No    Emotionally Abused: No    Physically Abused: No    Sexually Abused: No   Social History   Tobacco Use  Smoking Status Never  Smokeless Tobacco Never   Social History   Substance and Sexual Activity  Alcohol Use No    Family History:  Family History  Problem Relation Age of Onset   Arthritis Mother    Hyperlipidemia Mother    Hypertension Mother    Depression Mother    Heart attack Father        stents placed   Heart disease Father    Depression Brother    Dementia Maternal Grandmother    Arthritis Maternal Grandfather    Suicidality Paternal Grandfather    Colon cancer Neg Hx    Esophageal cancer Neg Hx    Rectal cancer Neg Hx    Stomach cancer Neg Hx     Past medical history, surgical history, medications, allergies, family history and social history reviewed with patient today and changes made to appropriate areas of the chart.   ROS All other ROS negative except what is listed above and in the HPI.      Objective:    BP 122/82   Pulse 99   Temp (!) 97.5 F (36.4 C)   Ht 6' 0.5" (1.842 m)   Wt 174 lb 3.2 oz (79 kg)   SpO2 100%   BMI 23.30 kg/m   Wt Readings from Last 3 Encounters:  11/13/22 174 lb 3.2 oz (79 kg)  08/10/22 187 lb 9.6 oz (85.1 kg)  03/09/22 180 lb 11.2 oz (82 kg)    Physical Exam Vitals and nursing note reviewed.  Constitutional:  General: He is awake. He is not in acute distress.    Appearance: He is well-developed and well-groomed. He is not ill-appearing or toxic-appearing.  HENT:     Head: Normocephalic and atraumatic.     Right Ear: Hearing, tympanic membrane, ear canal and external ear normal. No drainage.     Left Ear: Hearing, tympanic membrane, ear canal and external ear normal. No drainage.     Nose: Nose normal.     Mouth/Throat:     Pharynx: Uvula midline.  Eyes:     General:  Lids are normal.        Right eye: No discharge.        Left eye: No discharge.     Extraocular Movements: Extraocular movements intact.     Conjunctiva/sclera: Conjunctivae normal.     Pupils: Pupils are equal, round, and reactive to light.     Visual Fields: Right eye visual fields normal and left eye visual fields normal.  Neck:     Thyroid: No thyromegaly.     Vascular: No carotid bruit or JVD.     Trachea: Trachea normal.  Cardiovascular:     Rate and Rhythm: Normal rate and regular rhythm.     Heart sounds: Normal heart sounds, S1 normal and S2 normal. No murmur heard.    No gallop.  Pulmonary:     Effort: Pulmonary effort is normal. No accessory muscle usage or respiratory distress.     Breath sounds: Normal breath sounds.  Abdominal:     General: Bowel sounds are normal.     Palpations: Abdomen is soft. There is no hepatomegaly or splenomegaly.     Tenderness: There is no abdominal tenderness.  Musculoskeletal:        General: Normal range of motion.     Cervical back: Normal range of motion and neck supple.     Right lower leg: No edema.     Left lower leg: No edema.  Lymphadenopathy:     Head:     Right side of head: No submental, submandibular, tonsillar, preauricular or posterior auricular adenopathy.     Left side of head: No submental, submandibular, tonsillar, preauricular or posterior auricular adenopathy.     Cervical: No cervical adenopathy.  Skin:    General: Skin is warm and dry.     Capillary Refill: Capillary refill takes less than 2 seconds.     Findings: No rash.  Neurological:     Mental Status: He is alert and oriented to person, place, and time.     Gait: Gait is intact.     Deep Tendon Reflexes: Reflexes are normal and symmetric.     Reflex Scores:      Brachioradialis reflexes are 2+ on the right side and 2+ on the left side.      Patellar reflexes are 2+ on the right side and 2+ on the left side. Psychiatric:        Attention and Perception:  Attention normal.        Mood and Affect: Mood normal.        Speech: Speech normal.        Behavior: Behavior normal. Behavior is cooperative.        Thought Content: Thought content normal.        Cognition and Memory: Cognition normal.     Results for orders placed or performed in visit on 01/18/22  CBC with Differential/Platelet  Result Value Ref Range   WBC 5.9 3.4 -  10.8 x10E3/uL   RBC 5.17 4.14 - 5.80 x10E6/uL   Hemoglobin 15.5 13.0 - 17.7 g/dL   Hematocrit 30.8 65.7 - 51.0 %   MCV 89 79 - 97 fL   MCH 30.0 26.6 - 33.0 pg   MCHC 33.8 31.5 - 35.7 g/dL   RDW 84.6 96.2 - 95.2 %   Platelets 210 150 - 450 x10E3/uL   Neutrophils 57 Not Estab. %   Lymphs 29 Not Estab. %   Monocytes 11 Not Estab. %   Eos 2 Not Estab. %   Basos 1 Not Estab. %   Neutrophils Absolute 3.3 1.4 - 7.0 x10E3/uL   Lymphocytes Absolute 1.7 0.7 - 3.1 x10E3/uL   Monocytes Absolute 0.6 0.1 - 0.9 x10E3/uL   EOS (ABSOLUTE) 0.1 0.0 - 0.4 x10E3/uL   Basophils Absolute 0.0 0.0 - 0.2 x10E3/uL   Immature Granulocytes 0 Not Estab. %   Immature Grans (Abs) 0.0 0.0 - 0.1 x10E3/uL  Comprehensive metabolic panel  Result Value Ref Range   Glucose 85 70 - 99 mg/dL   BUN 13 6 - 24 mg/dL   Creatinine, Ser 8.41 (H) 0.76 - 1.27 mg/dL   eGFR 66 >32 GM/WNU/2.72   BUN/Creatinine Ratio 10 9 - 20   Sodium 143 134 - 144 mmol/L   Potassium 4.1 3.5 - 5.2 mmol/L   Chloride 106 96 - 106 mmol/L   CO2 24 20 - 29 mmol/L   Calcium 9.7 8.7 - 10.2 mg/dL   Total Protein 7.4 6.0 - 8.5 g/dL   Albumin 4.8 4.1 - 5.1 g/dL   Globulin, Total 2.6 1.5 - 4.5 g/dL   Albumin/Globulin Ratio 1.8 1.2 - 2.2   Bilirubin Total 0.4 0.0 - 1.2 mg/dL   Alkaline Phosphatase 125 (H) 44 - 121 IU/L   AST 23 0 - 40 IU/L   ALT 26 0 - 44 IU/L  Lipid Panel w/o Chol/HDL Ratio  Result Value Ref Range   Cholesterol, Total 172 100 - 199 mg/dL   Triglycerides 536 0 - 149 mg/dL   HDL 45 >64 mg/dL   VLDL Cholesterol Cal 26 5 - 40 mg/dL   LDL Chol Calc (NIH)  101 (H) 0 - 99 mg/dL  TSH  Result Value Ref Range   TSH 2.620 0.450 - 4.500 uIU/mL  PSA  Result Value Ref Range   Prostate Specific Ag, Serum 0.9 0.0 - 4.0 ng/mL  Magnesium  Result Value Ref Range   Magnesium 2.3 1.6 - 2.3 mg/dL  HgB Q0H  Result Value Ref Range   Hgb A1c MFr Bld 5.8 (H) 4.8 - 5.6 %   Est. average glucose Bld gHb Est-mCnc 120 mg/dL      Assessment & Plan:   Problem List Items Addressed This Visit       Cardiovascular and Mediastinum   Migraine headache without aura (Chronic)    Chronic, ongoing.  Followed by headache clinic in [place], continue this collaboration and medications as ordered by them.  Will attempt to get notes from clinic, may need release of records.      Relevant Orders   CBC with Differential/Platelet   TSH     Digestive   Acid reflux (Chronic)    Chronic, ongoing.  Continue Omeprazole daily and check Mag level annually.  Risks of PPI use were discussed with patient including bone loss, C. Diff diarrhea, pneumonia, infections, CKD, electrolyte abnormalities.  Verbalizes understanding and chooses to continue the medication.       Relevant Orders  Magnesium     Other   Attention deficit hyperactivity disorder (ADHD) (Chronic)    Chronic, ongoing.  Followed by [facility] -- continue this collaboration and medications as ordered by them.  Attempt to get notes next visit.      Blindness of right eye (Chronic)    Since childhood, monitor only.      Elevated hemoglobin A1c measurement (Chronic)    Noted on past labs, check CMP and A1c today.  Adjust plan as needed.      Relevant Orders   Comprehensive metabolic panel   HgB A1c   Elevated low density lipoprotein (LDL) cholesterol level (Chronic)    Noted on past labs, check lipid panel today and initiate medication as needed. The 10-year ASCVD risk score (Arnett DK, et al., 2019) is: 3.2%   Values used to calculate the score:     Age: 37 years     Sex: Male     Is  Non-Hispanic African American: No     Diabetic: No     Tobacco smoker: No     Systolic Blood Pressure: 122 mmHg     Is BP treated: No     HDL Cholesterol: 45 mg/dL     Total Cholesterol: 172 mg/dL       Relevant Orders   Comprehensive metabolic panel   Lipid Panel w/o Chol/HDL Ratio   Recurrent major depressive disorder, in full remission (HCC) - Primary (Chronic)    Chronic, ongoing.  Denies SI/HI.  Followed by [facility] -- continue this collaboration and medications as ordered by them.  Attempt to get notes next visit.      Other Visit Diagnoses     Benign prostatic hyperplasia without lower urinary tract symptoms       PSA on labs today.   Relevant Orders   PSA   Flu vaccine need       Provided flu vaccine today with patient consent.   Relevant Orders   Flu vaccine trivalent PF, 6mos and older(Flulaval,Afluria,Fluarix,Fluzone)   Encounter for annual physical exam       Annual physical today with labs and health maintenance reviewed, discussed with patient.       Discussed aspirin prophylaxis for myocardial infarction prevention and decision was it was not indicated  LABORATORY TESTING:  Health maintenance labs ordered today as discussed above.   The natural history of prostate cancer and ongoing controversy regarding screening and potential treatment outcomes of prostate cancer has been discussed with the patient. The meaning of a false positive PSA and a false negative PSA has been discussed. He indicates understanding of the limitations of this screening test and wishes to proceed with screening PSA testing.  IMMUNIZATIONS:   - Tdap: Tetanus vaccination status reviewed: last tetanus booster within 10 years. - Influenza: Administered today - Pneumovax: Not applicable - Prevnar: Not applicable - Zostavax vaccine: Given elsewhere at [facility]  SCREENING: - Colonoscopy: Up to date on 03/31/2020 at [facility], will get records Discussed with patient purpose of the  colonoscopy is to detect colon cancer at curable precancerous or early stages   - AAA Screening: Not applicable  -Hearing Test: Not applicable  -Spirometry: Not applicable   PATIENT COUNSELING:    Sexuality: Discussed sexually transmitted diseases, partner selection, use of condoms, avoidance of unintended pregnancy  and contraceptive alternatives.   Advised to avoid cigarette smoking.  I discussed with the patient that most people either abstain from alcohol or drink within safe limits (<=14/week and <=4  drinks/occasion for males, <=7/weeks and <= 3 drinks/occasion for females) and that the risk for alcohol disorders and other health effects rises proportionally with the number of drinks per week and how often a drinker exceeds daily limits.  Discussed cessation/primary prevention of drug use and availability of treatment for abuse.   Diet: Encouraged to adjust caloric intake to maintain  or achieve ideal body weight, to reduce intake of dietary saturated fat and total fat, to limit sodium intake by avoiding high sodium foods and not adding table salt, and to maintain adequate dietary potassium and calcium preferably from fresh fruits, vegetables, and low-fat dairy products.    Stressed the importance of regular exercise  Injury prevention: Discussed safety belts, safety helmets, smoke detector, smoking near bedding or upholstery.   Dental health: Discussed importance of regular tooth brushing, flossing, and dental visits.   Follow up plan: NEXT PREVENTATIVE PHYSICAL DUE IN 1 YEAR. Return in about 6 months (around 05/16/2023) for MOOD, MIGRAINES, GERD.

## 2022-11-13 NOTE — Assessment & Plan Note (Signed)
Since childhood, monitor only.

## 2022-11-13 NOTE — Assessment & Plan Note (Signed)
Noted on past labs, check lipid panel today and initiate medication as needed. The 10-year ASCVD risk score (Arnett DK, et al., 2019) is: 3.2%   Values used to calculate the score:     Age: 52 years     Sex: Male     Is Non-Hispanic African American: No     Diabetic: No     Tobacco smoker: No     Systolic Blood Pressure: 122 mmHg     Is BP treated: No     HDL Cholesterol: 45 mg/dL     Total Cholesterol: 172 mg/dL

## 2022-11-13 NOTE — Assessment & Plan Note (Signed)
Chronic, ongoing.  Continue Omeprazole daily and check Mag level annually.  Risks of PPI use were discussed with patient including bone loss, C. Diff diarrhea, pneumonia, infections, CKD, electrolyte abnormalities.  Verbalizes understanding and chooses to continue the medication.

## 2022-11-13 NOTE — Assessment & Plan Note (Signed)
Noted on past labs, check CMP and A1c today.  Adjust plan as needed.

## 2022-11-13 NOTE — Assessment & Plan Note (Signed)
Chronic, ongoing.  Followed by headache clinic in [place], continue this collaboration and medications as ordered by them.  Will attempt to get notes from clinic, may need release of records.

## 2022-11-13 NOTE — Assessment & Plan Note (Signed)
Chronic, ongoing.  Denies SI/HI.  Followed by [facility] -- continue this collaboration and medications as ordered by them.  Attempt to get notes next visit.

## 2022-11-13 NOTE — Assessment & Plan Note (Signed)
Chronic, ongoing.  Followed by [facility] -- continue this collaboration and medications as ordered by them.  Attempt to get notes next visit.

## 2022-11-14 LAB — CBC WITH DIFFERENTIAL/PLATELET
Basophils Absolute: 0 10*3/uL (ref 0.0–0.2)
Basos: 1 %
EOS (ABSOLUTE): 0.2 10*3/uL (ref 0.0–0.4)
Eos: 3 %
Hematocrit: 45.2 % (ref 37.5–51.0)
Hemoglobin: 15.5 g/dL (ref 13.0–17.7)
Immature Grans (Abs): 0 10*3/uL (ref 0.0–0.1)
Immature Granulocytes: 0 %
Lymphocytes Absolute: 1.7 10*3/uL (ref 0.7–3.1)
Lymphs: 28 %
MCH: 30.4 pg (ref 26.6–33.0)
MCHC: 34.3 g/dL (ref 31.5–35.7)
MCV: 89 fL (ref 79–97)
Monocytes Absolute: 0.6 10*3/uL (ref 0.1–0.9)
Monocytes: 10 %
Neutrophils Absolute: 3.5 10*3/uL (ref 1.4–7.0)
Neutrophils: 58 %
Platelets: 211 10*3/uL (ref 150–450)
RBC: 5.1 x10E6/uL (ref 4.14–5.80)
RDW: 12.3 % (ref 11.6–15.4)
WBC: 5.9 10*3/uL (ref 3.4–10.8)

## 2022-11-14 LAB — COMPREHENSIVE METABOLIC PANEL
ALT: 27 IU/L (ref 0–44)
AST: 25 IU/L (ref 0–40)
Albumin: 4.7 g/dL (ref 3.8–4.9)
Alkaline Phosphatase: 126 IU/L — ABNORMAL HIGH (ref 44–121)
BUN/Creatinine Ratio: 8 — ABNORMAL LOW (ref 9–20)
BUN: 11 mg/dL (ref 6–24)
Bilirubin Total: 0.5 mg/dL (ref 0.0–1.2)
CO2: 22 mmol/L (ref 20–29)
Calcium: 9.7 mg/dL (ref 8.7–10.2)
Chloride: 105 mmol/L (ref 96–106)
Creatinine, Ser: 1.3 mg/dL — ABNORMAL HIGH (ref 0.76–1.27)
Globulin, Total: 2.3 g/dL (ref 1.5–4.5)
Glucose: 103 mg/dL — ABNORMAL HIGH (ref 70–99)
Potassium: 4.5 mmol/L (ref 3.5–5.2)
Sodium: 140 mmol/L (ref 134–144)
Total Protein: 7 g/dL (ref 6.0–8.5)
eGFR: 67 mL/min/{1.73_m2} (ref >59)

## 2022-11-14 LAB — TSH: TSH: 1.51 u[IU]/mL (ref 0.450–4.500)

## 2022-11-14 LAB — LIPID PANEL W/O CHOL/HDL RATIO
Cholesterol, Total: 175 mg/dL (ref 100–199)
HDL: 47 mg/dL (ref >39)
LDL Chol Calc (NIH): 108 mg/dL — ABNORMAL HIGH (ref 0–99)
Triglycerides: 111 mg/dL (ref 0–149)
VLDL Cholesterol Cal: 20 mg/dL (ref 5–40)

## 2022-11-14 LAB — HEMOGLOBIN A1C
Est. average glucose Bld gHb Est-mCnc: 117 mg/dL
Hgb A1c MFr Bld: 5.7 % — ABNORMAL HIGH (ref 4.8–5.6)

## 2022-11-14 LAB — MAGNESIUM: Magnesium: 2.2 mg/dL (ref 1.6–2.3)

## 2022-11-14 LAB — PSA: Prostate Specific Ag, Serum: 0.7 ng/mL (ref 0.0–4.0)

## 2022-11-14 NOTE — Progress Notes (Signed)
Contacted via MyChart   Good evening Douglas Landry, your labs have returned: - Kidney function, creatinine and eGFR, remains stable, as is liver function, AST and ALT.  - Alkaline phosphatase mildly elevated -- this can be related to gall bladder.  Do you still have gall bladder? We will continue to monitor. - A1c remains in prediabetic range but right at low end of 5.7 to 6.4% prediabetes numbers.  Continue focus on diet and regular activity. - CBC, thyroid, prostate, and magnesium all normal.:) - LDL, bad cholesterol, is still elevated.  The LDL is the bad cholesterol. Over time and in combination with inflammation and other factors, this contributes to plaque which in turn may lead to stroke and/or heart attack down the road. Sometimes high LDL is primarily genetic, and people might be eating all the right foods but still have high numbers. Other times, there is room for improvement in one's diet and eating healthier can bring this number down and potentially reduce one's risk of heart attack and/or stroke.   To reduce your LDL, Remember - more fruits and vegetables, more fish, and limit red meat and dairy products. More soy, nuts, beans, barley, lentils, oats and plant sterol ester enriched margarine instead of butter. I also encourage eliminating sugar and processed food. Remember, shop on the outside of the grocery store and visit your [employer]. Any questions? Keep being amazing!!  Thank you for allowing me to participate in your care.  I appreciate you. Kindest regards, Douglas Landry

## 2022-11-27 ENCOUNTER — Telehealth: Payer: Self-pay | Admitting: Nurse Practitioner

## 2022-11-27 NOTE — Telephone Encounter (Signed)
Patient dropped off Wellness Screening form to be completed by Aura Dials, NP. I am placing form in providers folder. Informed patient to allow provider 48-72 hours to complete. Contact patient [phone] when complete.

## 2022-11-27 NOTE — Telephone Encounter (Signed)
Form started. Placed in providers folder for signature.

## 2022-11-28 NOTE — Telephone Encounter (Signed)
Form completed. Copy placed in the scan bin and original placed in the complete bin for patient to pick up.   Called and LVM letting patient know that his form was ready to be picked up.

## 2022-12-05 ENCOUNTER — Ambulatory Visit: Payer: Managed Care, Other (non HMO) | Admitting: Nurse Practitioner

## 2022-12-08 NOTE — Patient Instructions (Signed)

## 2022-12-11 ENCOUNTER — Ambulatory Visit (INDEPENDENT_AMBULATORY_CARE_PROVIDER_SITE_OTHER): Payer: Managed Care, Other (non HMO) | Admitting: Nurse Practitioner

## 2022-12-11 ENCOUNTER — Encounter: Payer: Self-pay | Admitting: Nurse Practitioner

## 2022-12-11 VITALS — BP 111/75 | HR 90 | Temp 98.2°F | Ht 72.5 in | Wt 175.8 lb

## 2022-12-11 DIAGNOSIS — R1084 Generalized abdominal pain: Secondary | ICD-10-CM | POA: Insufficient documentation

## 2022-12-11 NOTE — Progress Notes (Signed)
BP 111/75   Pulse 90   Temp 98.2 F (36.8 C) (Oral)   Ht 6' 0.5" (1.842 m)   Wt 175 lb 12.8 oz (79.7 kg)   SpO2 98%   BMI 23.52 kg/m    Subjective:    Patient ID: Douglas Landry, male    DOB: Jul 22, 1970, 52 y.o.   MRN: [id number]  HPI: Douglas Landry is a 52 y.o. male  Chief Complaint  Patient presents with   Abdominal Pain    Patient states he has been having abdominal pain off and on for the last month. States he has also experience some rectal incontinence as well as off and on diarrhea. States he has also had 2 children that have had C Diff recently. States he has occasionally experienced stabbing pains as well.    ABDOMINAL PAIN  Presents today for abdominal pain for one month.  Has 2 children at home with C. Diff recently. He has had occasional incontinence issue of stool and diarrhea.  Having incontinence issues daily at times.  Diarrhea is not every day.  He does not notice odor to stool.  No recent diet changes.  Last colonoscopy was 03/31/20 with no worrisome findings. Duration:weeks Onset: sudden Severity: 2/10 at present, sometimes higher levels Quality: aching and cramping Location:  diffuse both sides midline and center Episode duration: one month has been present Radiation: no Frequency: intermittent Alleviating factors: nothing -- not really taking anything Aggravating factors: nothing Status: stable Treatments attempted: none Fever: no Nausea: occasional and had metal taste in mouth at one time Vomiting: no Weight loss: no Decreased appetite: slightly lower Diarrhea: yes Constipation: no Blood in stool: no Heartburn: no Jaundice: no Rash: no Dysuria/urinary frequency: no Hematuria: no History of sexually transmitted disease: no Recurrent NSAID use: no   Relevant past medical, surgical, family and social history reviewed and updated as indicated. Interim medical history since our last visit reviewed. Allergies and medications reviewed and  updated.  Review of Systems  Constitutional:  Negative for activity change, diaphoresis, fatigue and fever.  Respiratory:  Negative for cough, chest tightness, shortness of breath and wheezing.   Cardiovascular:  Negative for chest pain, palpitations and leg swelling.  Gastrointestinal:  Positive for abdominal pain, diarrhea and nausea. Negative for abdominal distention, blood in stool, constipation, rectal pain and vomiting.  Endocrine: Negative for cold intolerance and heat intolerance.  Neurological: Negative.   Psychiatric/Behavioral: Negative.     Per HPI unless specifically indicated above     Objective:    BP 111/75   Pulse 90   Temp 98.2 F (36.8 C) (Oral)   Ht 6' 0.5" (1.842 m)   Wt 175 lb 12.8 oz (79.7 kg)   SpO2 98%   BMI 23.52 kg/m   Wt Readings from Last 3 Encounters:  12/11/22 175 lb 12.8 oz (79.7 kg)  11/13/22 174 lb 3.2 oz (79 kg)  08/10/22 187 lb 9.6 oz (85.1 kg)    Physical Exam Vitals and nursing note reviewed.  Constitutional:      General: He is awake. He is not in acute distress.    Appearance: Normal appearance. He is well-developed and well-groomed. He is not ill-appearing or toxic-appearing.  HENT:     Head: Normocephalic.     Right Ear: Hearing and external ear normal.     Left Ear: Hearing and external ear normal.  Eyes:     General: Lids are normal.     Extraocular Movements: Extraocular movements intact.  Conjunctiva/sclera: Conjunctivae normal.  Neck:     Thyroid: No thyromegaly.     Vascular: No carotid bruit.  Cardiovascular:     Rate and Rhythm: Normal rate and regular rhythm.     Heart sounds: Normal heart sounds. No murmur heard.    No gallop.  Pulmonary:     Effort: No accessory muscle usage or respiratory distress.     Breath sounds: Normal breath sounds.  Abdominal:     General: Bowel sounds are normal. There is no distension.     Palpations: Abdomen is soft. There is no hepatomegaly.     Tenderness: There is generalized  abdominal tenderness. There is no right CVA tenderness, left CVA tenderness, guarding or rebound. Negative signs include Murphy's sign.     Comments: Mild tenderness noted to both midline left and right sides, plus slightly to LLQ.  Musculoskeletal:     Cervical back: Full passive range of motion without pain.     Right lower leg: No edema.     Left lower leg: No edema.  Lymphadenopathy:     Cervical: No cervical adenopathy.  Skin:    General: Skin is warm.     Capillary Refill: Capillary refill takes less than 2 seconds.  Neurological:     Mental Status: He is alert and oriented to person, place, and time.     Deep Tendon Reflexes: Reflexes are normal and symmetric.     Reflex Scores:      Brachioradialis reflexes are 2+ on the right side and 2+ on the left side.      Patellar reflexes are 2+ on the right side and 2+ on the left side. Psychiatric:        Attention and Perception: Attention normal.        Mood and Affect: Mood normal.        Speech: Speech normal.        Behavior: Behavior normal. Behavior is cooperative.        Thought Content: Thought content normal.     Results for orders placed or performed in visit on 11/13/22  CBC with Differential/Platelet  Result Value Ref Range   WBC 5.9 3.4 - 10.8 x10E3/uL   RBC 5.10 4.14 - 5.80 x10E6/uL   Hemoglobin 15.5 13.0 - 17.7 g/dL   Hematocrit 25.9 56.3 - 51.0 %   MCV 89 79 - 97 fL   MCH 30.4 26.6 - 33.0 pg   MCHC 34.3 31.5 - 35.7 g/dL   RDW 87.5 64.3 - 32.9 %   Platelets 211 150 - 450 x10E3/uL   Neutrophils 58 Not Estab. %   Lymphs 28 Not Estab. %   Monocytes 10 Not Estab. %   Eos 3 Not Estab. %   Basos 1 Not Estab. %   Neutrophils Absolute 3.5 1.4 - 7.0 x10E3/uL   Lymphocytes Absolute 1.7 0.7 - 3.1 x10E3/uL   Monocytes Absolute 0.6 0.1 - 0.9 x10E3/uL   EOS (ABSOLUTE) 0.2 0.0 - 0.4 x10E3/uL   Basophils Absolute 0.0 0.0 - 0.2 x10E3/uL   Immature Granulocytes 0 Not Estab. %   Immature Grans (Abs) 0.0 0.0 - 0.1  x10E3/uL  Comprehensive metabolic panel  Result Value Ref Range   Glucose 103 (H) 70 - 99 mg/dL   BUN 11 6 - 24 mg/dL   Creatinine, Ser 5.18 (H) 0.76 - 1.27 mg/dL   eGFR 67 >84 ZY/SAY/3.01   BUN/Creatinine Ratio 8 (L) 9 - 20   Sodium 140 134 - 144 mmol/L  Potassium 4.5 3.5 - 5.2 mmol/L   Chloride 105 96 - 106 mmol/L   CO2 22 20 - 29 mmol/L   Calcium 9.7 8.7 - 10.2 mg/dL   Total Protein 7.0 6.0 - 8.5 g/dL   Albumin 4.7 3.8 - 4.9 g/dL   Globulin, Total 2.3 1.5 - 4.5 g/dL   Bilirubin Total 0.5 0.0 - 1.2 mg/dL   Alkaline Phosphatase 126 (H) 44 - 121 IU/L   AST 25 0 - 40 IU/L   ALT 27 0 - 44 IU/L  Lipid Panel w/o Chol/HDL Ratio  Result Value Ref Range   Cholesterol, Total 175 100 - 199 mg/dL   Triglycerides 409 0 - 149 mg/dL   HDL 47 >81 mg/dL   VLDL Cholesterol Cal 20 5 - 40 mg/dL   LDL Chol Calc (NIH) 191 (H) 0 - 99 mg/dL  TSH  Result Value Ref Range   TSH 1.510 0.450 - 4.500 uIU/mL  PSA  Result Value Ref Range   Prostate Specific Ag, Serum 0.7 0.0 - 4.0 ng/mL  Magnesium  Result Value Ref Range   Magnesium 2.2 1.6 - 2.3 mg/dL  HgB Y7W  Result Value Ref Range   Hgb A1c MFr Bld 5.7 (H) 4.8 - 5.6 %   Est. average glucose Bld gHb Est-mCnc 117 mg/dL      Assessment & Plan:   Problem List Items Addressed This Visit       Other   Generalized abdominal pain - Primary    Ongoing for one month, 2 children at home with C Diff.  ?infectious element.  Will obtain stool samples and blood work: CBC, CMP, TSH, GGT.  Educated him on good hand washing since children at home with infection.  Discussed how C Diff spreads.  No red flags on exam.  Will determine next steps once all labs have returned.      Relevant Orders   H. pylori antigen, stool   Cdiff NAA+O+P+Stool Culture   Comprehensive metabolic panel   CBC with Differential/Platelet   Gamma GT   TSH     Follow up plan: Return in about 2 weeks (around 12/25/2022) for Abdominal Pain.

## 2022-12-11 NOTE — Assessment & Plan Note (Signed)
Ongoing for one month, 2 children at home with C Diff.  ?infectious element.  Will obtain stool samples and blood work: CBC, CMP, TSH, GGT.  Educated him on good hand washing since children at home with infection.  Discussed how C Diff spreads.  No red flags on exam.  Will determine next steps once all labs have returned.

## 2022-12-12 LAB — CBC WITH DIFFERENTIAL/PLATELET
Basophils Absolute: 0 10*3/uL (ref 0.0–0.2)
Basos: 1 %
EOS (ABSOLUTE): 0.2 10*3/uL (ref 0.0–0.4)
Eos: 3 %
Hematocrit: 46.3 % (ref 37.5–51.0)
Hemoglobin: 15.4 g/dL (ref 13.0–17.7)
Immature Grans (Abs): 0 10*3/uL (ref 0.0–0.1)
Immature Granulocytes: 0 %
Lymphocytes Absolute: 1.7 10*3/uL (ref 0.7–3.1)
Lymphs: 29 %
MCH: 30.1 pg (ref 26.6–33.0)
MCHC: 33.3 g/dL (ref 31.5–35.7)
MCV: 91 fL (ref 79–97)
Monocytes Absolute: 0.5 10*3/uL (ref 0.1–0.9)
Monocytes: 9 %
Neutrophils Absolute: 3.5 10*3/uL (ref 1.4–7.0)
Neutrophils: 58 %
Platelets: 213 10*3/uL (ref 150–450)
RBC: 5.11 x10E6/uL (ref 4.14–5.80)
RDW: 12.5 % (ref 11.6–15.4)
WBC: 5.9 10*3/uL (ref 3.4–10.8)

## 2022-12-12 LAB — COMPREHENSIVE METABOLIC PANEL
ALT: 41 IU/L (ref 0–44)
AST: 26 IU/L (ref 0–40)
Albumin: 4.9 g/dL (ref 3.8–4.9)
Alkaline Phosphatase: 120 IU/L (ref 44–121)
BUN/Creatinine Ratio: 11 (ref 9–20)
BUN: 14 mg/dL (ref 6–24)
Bilirubin Total: 0.4 mg/dL (ref 0.0–1.2)
CO2: 23 mmol/L (ref 20–29)
Calcium: 9.8 mg/dL (ref 8.7–10.2)
Chloride: 106 mmol/L (ref 96–106)
Creatinine, Ser: 1.28 mg/dL — ABNORMAL HIGH (ref 0.76–1.27)
Globulin, Total: 2.2 g/dL (ref 1.5–4.5)
Glucose: 94 mg/dL (ref 70–99)
Potassium: 4.2 mmol/L (ref 3.5–5.2)
Sodium: 140 mmol/L (ref 134–144)
Total Protein: 7.1 g/dL (ref 6.0–8.5)
eGFR: 68 mL/min/{1.73_m2} (ref >59)

## 2022-12-12 LAB — GAMMA GT: GGT: 18 IU/L (ref 0–65)

## 2022-12-12 LAB — TSH: TSH: 2.2 u[IU]/mL (ref 0.450–4.500)

## 2022-12-12 NOTE — Progress Notes (Signed)
Contacted via MyChart   Good morning Douglas Landry, your labs have returned and overall are stable.  We will see what stool samples show.  Any questions? Keep being amazing!!  Thank you for allowing me to participate in your care.  I appreciate you. Kindest regards, [place] Ament

## 2022-12-16 LAB — CDIFF NAA+O+P+STOOL CULTURE
Stool Culture result 1 (CMPCXR): NEGATIVE
Toxigenic C. Difficile by PCR: NEGATIVE
Toxigenic C. Difficile by PCR: NEGATIVE

## 2022-12-16 LAB — H. PYLORI ANTIGEN, STOOL: H pylori Ag, Stl: NEGATIVE

## 2022-12-16 NOTE — Progress Notes (Signed)
Contacted via MyChart   Stool sample all negative:)

## 2022-12-16 NOTE — Progress Notes (Signed)
Contacted via MyChart   Current stool results are negative, including C Difficile, waiting on one more result to return.  Any questions?

## 2023-05-12 NOTE — Patient Instructions (Signed)
 Managing Depression, Adult Depression is a mental health condition that affects your thoughts, feelings, and actions. Being diagnosed with depression can bring you relief if you did not know why you have felt or behaved a certain way. It could also leave you feeling overwhelmed. Finding ways to manage your symptoms can help you feel more positive about your future. How to manage lifestyle changes Being depressed is difficult. Depression can increase the level of everyday stress. Stress can make depression symptoms worse. You may believe your symptoms cannot be managed or will never improve. However, there are many things you can try to help manage your symptoms. There is hope. Managing stress  Stress is your body's reaction to life changes and events, both good and bad. Stress can add to your feelings of depression. Learning to manage your stress can help lessen your feelings of depression. Try some of the following approaches to reducing your stress (stress reduction techniques): Listen to music that you enjoy and that inspires you. Try using a meditation app or take a meditation class. Develop a practice that helps you connect with your spiritual self. Walk in nature, pray, or go to a place of worship. Practice deep breathing. To do this, inhale slowly through your nose. Pause at the top of your inhale for a few seconds and then exhale slowly, letting yourself relax. Repeat this three or four times. Practice yoga to help relax and work your muscles. Choose a stress reduction technique that works for you. These techniques take time and practice to develop. Set aside 5-15 minutes a day to do them. Therapists can offer training in these techniques. Do these things to help manage stress: Keep a journal. Know your limits. Set healthy boundaries for yourself and others, such as saying "no" when you think something is too much. Pay attention to how you react to certain situations. You may not be able to  control everything, but you can change your reaction. Add humor to your life by watching funny movies or shows. Make time for activities that you enjoy and that relax you. Spend less time using electronics, especially at night before bed. The light from screens can make your brain think it is time to get up rather than go to bed.  Medicines Medicines, such as antidepressants, are often a part of treatment for depression. Talk with your pharmacist or health care provider about all the medicines, supplements, and herbal products that you take, their possible side effects, and what medicines and other products are safe to take together. Make sure to report any side effects you may have to your health care provider. Relationships Your health care provider may suggest family therapy, couples therapy, or individual therapy as part of your treatment. How to recognize changes Everyone responds differently to treatment for depression. As you recover from depression, you may start to: Have more interest in doing activities. Feel more hopeful. Have more energy. Eat a more regular amount of food. Have better mental focus. It is important to recognize if your depression is not getting better or is getting worse. The symptoms you had in the beginning may return, such as: Feeling tired. Eating too much or too little. Sleeping too much or too little. Feeling restless, agitated, or hopeless. Trouble focusing or making decisions. Having unexplained aches and pains. Feeling irritable, angry, or aggressive. If you or your family members notice these symptoms coming back, let your health care provider know right away. Follow these instructions at home: Activity Try to  get some form of exercise each day, such as walking. Try yoga, mindfulness, or other stress reduction techniques. Participate in group activities if you are able. Lifestyle Get enough sleep. Cut down on or stop using caffeine, tobacco,  alcohol, and any other harmful substances. Eat a healthy diet that includes plenty of vegetables, fruits, whole grains, low-fat dairy products, and lean protein. Limit foods that are high in solid fats, added sugar, or salt (sodium). General instructions Take over-the-counter and prescription medicines only as told by your health care provider. Keep all follow-up visits. It is important for your health care provider to check on your mood, behavior, and medicines. Your health care provider may need to make changes to your treatment. Where to find support Talking to others  Friends and family members can be sources of support and guidance. Talk to trusted friends or family members about your condition. Explain your symptoms and let them know that you are working with a health care provider to treat your depression. Tell friends and family how they can help. Finances Find mental health providers that fit with your financial situation. Talk with your health care provider if you are worried about access to food, housing, or medicine. Call your insurance company to learn about your co-pays and prescription plan. Where to find more information You can find support in your area from: Anxiety and Depression Association of [place] (ADAA): adaa.org Mental Health [place]: mentalhealthamerica.net [employer] on Mental Illness: nami.org Contact a health care provider if: You stop taking your antidepressant medicines, and you have any of these symptoms: Nausea. Headache. Light-headedness. Chills and body aches. Not being able to sleep (insomnia). You or your friends and family think your depression is getting worse. Get help right away if: You have thoughts of hurting yourself or others. Get help right away if you feel like you may hurt yourself or others, or have thoughts about taking your own life. Go to your nearest emergency room or: Call 911. Call the National Suicide Prevention Lifeline at  360-042-0264 or 988. This is open 24 hours a day. Text the Crisis Text Line at 952-302-5572. This information is not intended to replace advice given to you by your health care provider. Make sure you discuss any questions you have with your health care provider. Document Revised: 07/11/2021 Document Reviewed: 07/11/2021 Elsevier Patient Education  2024 [employer].

## 2023-05-16 ENCOUNTER — Ambulatory Visit: Payer: Self-pay | Admitting: Nurse Practitioner

## 2023-05-16 ENCOUNTER — Encounter: Payer: Self-pay | Admitting: Nurse Practitioner

## 2023-05-16 VITALS — BP 120/75 | HR 91 | Temp 98.1°F | Ht 72.0 in | Wt 182.8 lb

## 2023-05-16 DIAGNOSIS — F900 Attention-deficit hyperactivity disorder, predominantly inattentive type: Secondary | ICD-10-CM | POA: Diagnosis not present

## 2023-05-16 DIAGNOSIS — M79604 Pain in right leg: Secondary | ICD-10-CM | POA: Insufficient documentation

## 2023-05-16 DIAGNOSIS — G43009 Migraine without aura, not intractable, without status migrainosus: Secondary | ICD-10-CM

## 2023-05-16 DIAGNOSIS — M79605 Pain in left leg: Secondary | ICD-10-CM | POA: Insufficient documentation

## 2023-05-16 DIAGNOSIS — F3342 Major depressive disorder, recurrent, in full remission: Secondary | ICD-10-CM | POA: Diagnosis not present

## 2023-05-16 NOTE — Progress Notes (Signed)
 BP 120/75 (BP Location: Left Arm, Patient Position: Sitting, Cuff Size: Small)   Pulse 91   Temp 98.1 F (36.7 C) (Oral)   Ht 6' (1.829 m)   Wt 182 lb 12.8 oz (82.9 kg)   SpO2 98%   BMI 24.79 kg/m    Subjective:    Patient ID: Douglas Landry, male    DOB: December 05, 1970, 53 y.o.   MRN: [id number]  HPI: Bharat Antillon is a 53 y.o. male  Chief Complaint  Patient presents with   Migraine    Patient said he needs to see his specialist again   Leg Pain    Patient said it's been going on for a few months now (left leg)   Depression   MIGRAINES Followed by the headache clinic in [place], no notes available on Epic.  Takes Zonegran & obtains injections in office every 6 weeks from headache provider, not Botox.  Has missed some recent visits with them and headaches have relapsed a little.  Has been off Emgality for 4 months.  In past he has taken Aimovig, Propranolol, and Imitrex. Duration: chronic Onset: gradual Severity: 8/10 Quality: dull, aching, and throbbing Frequency: intermittent Location: starts around neck and into anterior head Headache duration: 24 hours  Radiation: no Time of day headache occurs: varies Alleviating factors: injections and medications Aggravating factors: chocolate, caffeine absence  Headache status at time of visit: asymptomatic Treatments attempted: Aimovig, Propranolol, and Imitrex Aura: no -- in past Nausea:  yes Vomiting: yes Photophobia:  no Phonophobia: yes Effect on social functioning:  no Numbers of missed days of school/work each month: 0 Confusion:  no Gait disturbance/ataxia:  no Behavioral changes:  no Fevers:  no   LEG PAIN Has had bilateral leg pain for a few months, like a nerve pain.  Shoots down the leg.  Pain is about the same in both legs.  Sits a lot for his work.  Has standing desk, but does not use as much as he should. Duration: months Pain: yes Severity: 4/10 to 8/10 Quality:  sharp, aching, shooting, and  throbbing Location:  sometimes thigh to knee and sometimes all the way to feet Bilateral:  yes Onset: sudden Frequency: intermittent Time of  day:  varies Sudden unintentional leg jerking:   no Paresthesias:  no Decreased sensation:  no Weakness:   no Insomnia:   no Fatigue:   no Alleviating factors: nothing Aggravating factors: sitting for a length of time Status: stable Treatments attempted: nothing   DEPRESSION & ADHD Follows with psychiatry at [facility].  Takes Wellbutrin. In past he took Adderall which helped him, but insurance stopped covering. Mood status: stable Satisfied with current treatment?: yes Symptom severity: moderate  Duration of current treatment : chronic Side effects: no Medication compliance: good compliance Psychotherapy/counseling: none Previous psychiatric medications: multiple medications Depressed mood: a little more lately due to more pressure at work Anxious mood: yes Anhedonia: no Significant weight loss or gain: no Insomnia: no -- hard to wake up Fatigue: occasional  Feelings of worthlessness or guilt: no Impaired concentration/indecisiveness: yes Suicidal ideations: no Hopelessness: no Crying spells: no    05/16/2023    8:16 AM 12/11/2022   10:46 AM 11/13/2022    8:48 AM 08/10/2022   10:59 AM 01/18/2022    8:48 AM  Depression screen PHQ 2/9  Decreased Interest 0 1 1 1 1   Down, Depressed, Hopeless 1 1 1 1  0  PHQ - 2 Score 1 2 2 2 1   Altered sleeping  1 1 1 1 2   Tired, decreased energy 3 1 1 1 2   Change in appetite 0 1 0 0 0  Feeling bad or failure about yourself  2 2 2 3 3   Trouble concentrating 2 1 2 2 3   Moving slowly or fidgety/restless 0 1 1 0 1  Suicidal thoughts 0 0 0 0 0  PHQ-9 Score 9 9 9 9 12   Difficult doing work/chores Very difficult Somewhat difficult Somewhat difficult Very difficult Somewhat difficult       05/16/2023    8:17 AM 12/11/2022   10:48 AM 11/13/2022    8:49 AM 08/10/2022   11:00 AM  GAD 7 :  Generalized Anxiety Score  Nervous, Anxious, on Edge 2 2 2 3   Control/stop worrying 1 2 1 3   Worry too much - different things 2 1 1 2   Trouble relaxing 2 1 3 3   Restless 1 1 1  0  Easily annoyed or irritable 1 2 1 1   Afraid - awful might happen 0 1 2 3   Total GAD 7 Score 9 10 11 15   Anxiety Difficulty Very difficult Very difficult  Somewhat difficult   Relevant past medical, surgical, family and social history reviewed and updated as indicated. Interim medical history since our last visit reviewed. Allergies and medications reviewed and updated.  Review of Systems  Constitutional:  Negative for activity change, diaphoresis, fatigue and fever.  Respiratory:  Negative for cough, chest tightness, shortness of breath and wheezing.   Cardiovascular:  Negative for chest pain, palpitations and leg swelling.  Gastrointestinal: Negative.   Musculoskeletal:  Positive for back pain.  Neurological:  Positive for headaches. Negative for dizziness, tremors and weakness.  Psychiatric/Behavioral:  Negative for decreased concentration, self-injury, sleep disturbance and suicidal ideas. The patient is nervous/anxious.    Per HPI unless specifically indicated above     Objective:    BP 120/75 (BP Location: Left Arm, Patient Position: Sitting, Cuff Size: Small)   Pulse 91   Temp 98.1 F (36.7 C) (Oral)   Ht 6' (1.829 m)   Wt 182 lb 12.8 oz (82.9 kg)   SpO2 98%   BMI 24.79 kg/m   Wt Readings from Last 3 Encounters:  05/16/23 182 lb 12.8 oz (82.9 kg)  12/11/22 175 lb 12.8 oz (79.7 kg)  11/13/22 174 lb 3.2 oz (79 kg)    Physical Exam Vitals and nursing note reviewed.  Constitutional:      General: He is awake. He is not in acute distress.    Appearance: He is well-developed and well-groomed. He is not ill-appearing or toxic-appearing.  HENT:     Head: Normocephalic and atraumatic.     Right Ear: Hearing and external ear normal.     Left Ear: Hearing and external ear normal.  Eyes:      General: Lids are normal.        Right eye: No discharge.        Left eye: No discharge.     Conjunctiva/sclera: Conjunctivae normal.     Pupils: Pupils are equal, round, and reactive to light.  Neck:     Thyroid: No thyromegaly.     Vascular: No carotid bruit.     Trachea: Trachea normal.  Cardiovascular:     Rate and Rhythm: Normal rate and regular rhythm.     Heart sounds: Normal heart sounds, S1 normal and S2 normal. No murmur heard.    No gallop.  Pulmonary:     Effort: Pulmonary effort  is normal. No accessory muscle usage or respiratory distress.     Breath sounds: Normal breath sounds.  Abdominal:     General: Bowel sounds are normal. There is no distension.     Palpations: Abdomen is soft.     Tenderness: There is no abdominal tenderness.  Musculoskeletal:        General: Normal range of motion.     Cervical back: Normal range of motion and neck supple.     Thoracic back: No swelling, spasms or tenderness. Normal range of motion. Scoliosis present.     Lumbar back: No swelling or tenderness. Normal range of motion. Negative right straight leg raise test and negative left straight leg raise test. Scoliosis present.     Right lower leg: No edema.     Left lower leg: No edema.  Lymphadenopathy:     Cervical: No cervical adenopathy.  Skin:    General: Skin is warm and dry.     Capillary Refill: Capillary refill takes less than 2 seconds.  Neurological:     Mental Status: He is alert and oriented to person, place, and time.     Gait: Gait is intact.     Deep Tendon Reflexes: Reflexes are normal and symmetric.     Reflex Scores:      Brachioradialis reflexes are 2+ on the right side and 2+ on the left side.      Patellar reflexes are 2+ on the right side and 2+ on the left side. Psychiatric:        Attention and Perception: Attention normal.        Mood and Affect: Mood normal.        Speech: Speech normal.        Behavior: Behavior normal. Behavior is cooperative.         Thought Content: Thought content normal.        Cognition and Memory: Cognition normal.    Results for orders placed or performed in visit on 12/11/22  Comprehensive metabolic panel   Collection Time: 12/11/22 11:18 AM  Result Value Ref Range   Glucose 94 70 - 99 mg/dL   BUN 14 6 - 24 mg/dL   Creatinine, Ser 6.29 (H) 0.76 - 1.27 mg/dL   eGFR 68 >52 WU/XLK/4.40   BUN/Creatinine Ratio 11 9 - 20   Sodium 140 134 - 144 mmol/L   Potassium 4.2 3.5 - 5.2 mmol/L   Chloride 106 96 - 106 mmol/L   CO2 23 20 - 29 mmol/L   Calcium 9.8 8.7 - 10.2 mg/dL   Total Protein 7.1 6.0 - 8.5 g/dL   Albumin 4.9 3.8 - 4.9 g/dL   Globulin, Total 2.2 1.5 - 4.5 g/dL   Bilirubin Total 0.4 0.0 - 1.2 mg/dL   Alkaline Phosphatase 120 44 - 121 IU/L   AST 26 0 - 40 IU/L   ALT 41 0 - 44 IU/L  CBC with Differential/Platelet   Collection Time: 12/11/22 11:18 AM  Result Value Ref Range   WBC 5.9 3.4 - 10.8 x10E3/uL   RBC 5.11 4.14 - 5.80 x10E6/uL   Hemoglobin 15.4 13.0 - 17.7 g/dL   Hematocrit 10.2 72.5 - 51.0 %   MCV 91 79 - 97 fL   MCH 30.1 26.6 - 33.0 pg   MCHC 33.3 31.5 - 35.7 g/dL   RDW 36.6 44.0 - 34.7 %   Platelets 213 150 - 450 x10E3/uL   Neutrophils 58 Not Estab. %   Lymphs 29 Not  Estab. %   Monocytes 9 Not Estab. %   Eos 3 Not Estab. %   Basos 1 Not Estab. %   Neutrophils Absolute 3.5 1.4 - 7.0 x10E3/uL   Lymphocytes Absolute 1.7 0.7 - 3.1 x10E3/uL   Monocytes Absolute 0.5 0.1 - 0.9 x10E3/uL   EOS (ABSOLUTE) 0.2 0.0 - 0.4 x10E3/uL   Basophils Absolute 0.0 0.0 - 0.2 x10E3/uL   Immature Granulocytes 0 Not Estab. %   Immature Grans (Abs) 0.0 0.0 - 0.1 x10E3/uL  Gamma GT   Collection Time: 12/11/22 11:18 AM  Result Value Ref Range   GGT 18 0 - 65 IU/L  TSH   Collection Time: 12/11/22 11:18 AM  Result Value Ref Range   TSH 2.200 0.450 - 4.500 uIU/mL  Cdiff NAA+O+P+Stool Culture   Collection Time: 12/12/22  9:05 AM   Specimen: Stool   ST     CD- [id number] MI  Result Value Ref Range    Salmonella/Shigella Screen Final report    Stool Culture result 1 (RSASHR) Comment    Campylobacter Culture Final report    Stool Culture result 1 (CMPCXR) Comment    E coli, Shiga toxin Assay Negative Negative   OVA + PARASITE EXAM Final report    O&P result 1 Comment    Toxigenic C. Difficile by PCR Negative Negative  H. pylori antigen, stool   Collection Time: 12/12/22  9:05 AM  Result Value Ref Range   H pylori Ag, Stl Negative Negative      Assessment & Plan:   Problem List Items Addressed This Visit       Cardiovascular and Mediastinum   Migraine headache without aura (Chronic)   Chronic, ongoing.  Followed by headache clinic in [place], continue this collaboration and medications as ordered by them.  Will attempt to get notes from clinic.  Recommend he schedule follow-up with them ASAP.        Other   Attention deficit hyperactivity disorder (ADHD) (Chronic)   Chronic, ongoing.  Followed by [facility] -- continue this collaboration and medications as ordered by them.  Attempt to get notes.      Recurrent major depressive disorder, in full remission (HCC) - Primary (Chronic)   Chronic, ongoing.  Denies SI/HI.  Followed by [facility] -- continue this collaboration and medications as ordered by them.  Attempt to get notes.      Bilateral leg pain   For a few months, he sits long hours for his job.  Suspect some radiculopathy presenting.  Noted scoliosis on exam.  Recommend he get up frequently during day and stretch + use his standing desk as much as possible to take breaks from sitting.  No red flags symptoms.  If pain worsens to return to office.        Follow up plan: Return in about 6 months (around 11/13/2023) for Annual Physical -- after 11/13/23.

## 2023-05-16 NOTE — Assessment & Plan Note (Signed)
 Chronic, ongoing.  Followed by [facility] -- continue this collaboration and medications as ordered by them.  Attempt to get notes.

## 2023-05-16 NOTE — Assessment & Plan Note (Signed)
 Chronic, ongoing.  Denies SI/HI.  Followed by [facility] -- continue this collaboration and medications as ordered by them.  Attempt to get notes.

## 2023-05-16 NOTE — Assessment & Plan Note (Signed)
 For a few months, he sits long hours for his job.  Suspect some radiculopathy presenting.  Noted scoliosis on exam.  Recommend he get up frequently during day and stretch + use his standing desk as much as possible to take breaks from sitting.  No red flags symptoms.  If pain worsens to return to office.

## 2023-05-16 NOTE — Assessment & Plan Note (Signed)
 Chronic, ongoing.  Followed by headache clinic in [place], continue this collaboration and medications as ordered by them.  Will attempt to get notes from clinic.  Recommend he schedule follow-up with them ASAP.

## 2023-11-07 ENCOUNTER — Telehealth: Admitting: Physician Assistant

## 2023-11-07 DIAGNOSIS — N4889 Other specified disorders of penis: Secondary | ICD-10-CM

## 2023-11-18 NOTE — Progress Notes (Signed)
 Because of the issue described, I feel your condition warrants further evaluation and I recommend that you be seen for a face to face visit.  Please contact your primary care physician practice to be seen. Many offices offer virtual options to be seen via video if you are not comfortable going in person to a medical facility at this time. You will most likely require a referral to a Urologist if you do not have one already.   NOTE: You will NOT be charged for this eVisit.  If you do not have a PCP, [facility] offers a free physician referral service available at [phone]. Our trained staff has the experience, knowledge and resources to put you in touch with a physician who is right for you.    If you are having a true medical emergency please call 911.   Your e-visit answers were reviewed by a board certified advanced clinical practitioner to complete your personal care plan.  Thank you for using e-Visits.    I have spent 5 minutes in review of e-visit questionnaire, review and updating patient chart, medical decision making and response to patient.   Delon CHRISTELLA Dickinson, PA-C    I have spent 5 minutes in review of e-visit questionnaire, review and updating patient chart, medical decision making and response to patient.   Delon CHRISTELLA Dickinson, PA-C

## 2023-11-27 NOTE — Patient Instructions (Signed)
 Be Involved in Caring For Your Health:  Taking Medications When medications are taken as directed, they can greatly improve your health. But if they are not taken as prescribed, they may not work. In some cases, not taking them correctly can be harmful. To help ensure your treatment remains effective and safe, understand your medications and how to take them. Bring your medications to each visit for review by your provider.  Your lab results, notes, and after visit summary will be available on My Chart. We strongly encourage you to use this feature. If lab results are abnormal the clinic will contact you with the appropriate steps. If the clinic does not contact you assume the results are satisfactory. You can always view your results on My Chart. If you have questions regarding your health or results, please contact the clinic during office hours. You can also ask questions on My Chart.  We at [facility] are grateful that you chose [place] to provide your care. We strive to provide evidence-based and compassionate care and are always looking for feedback. If you get a survey from the clinic please complete this so we can hear your opinions.  Prediabetes: What to Know Prediabetes is when your blood sugar, also called glucose, is at a higher level than normal but not high enough for you to be diagnosed with type 2 diabetes (type 2 diabetes mellitus). Having prediabetes puts you at risk for getting type 2 diabetes. By making some healthy changes, you may be able to prevent or delay getting type 2 diabetes. This is important because type 2 diabetes can lead to serious problems. Some of these include: Heart disease. Stroke. Blindness. Kidney disease. Depression. Poor blood flow in the feet and legs. In very bad cases, this could lead to having a leg removed by surgery (amputation). What are the causes? The exact cause of prediabetes isn't known. It may result from insulin resistance. Insulin  resistance happens when cells in the body don't respond properly to insulin that the body makes. This can cause too much sugar to build up in the blood. High blood sugar, also called hyperglycemia, can develop. What increases the risk? Having a family member with type 2 diabetes. Being older than 52 years of age. Having had a temporary form of diabetes during a pregnancy. This is called gestational diabetes. Having had polycystic ovary syndrome (PCOS). Being overweight or obese. Being inactive and not getting much exercise. Having a history of heart disease. This may include problems with cholesterol levels, high levels of blood fats, or high blood pressure. What are the signs or symptoms? You may have no symptoms. If you do have symptoms, they may include: Increased hunger. Increased thirst. Needing to pee more often. Changes in how you see, like blurry vision. Feeling tired. How is this diagnosed? Prediabetes can be diagnosed with blood tests that check your blood sugar. One or more of these tests may be done: A fasting blood glucose (FBG) test. You won't be allowed to eat (you will fast) for at least 8 hours before a blood sample is taken. An A1C blood test, also called a hemoglobin A1C test. This test shows information about blood sugar levels over the past 2?3 months. An oral glucose tolerance test (OGTT). This test measures your blood sugar at two points in time: After you haven't eaten for a while. This is your baseline level. Two hours after you drink a beverage that has sugar in it. You may be diagnosed with prediabetes  if: Your FBG is 100?125 mg/dL (1.6-1.0 mmol/L). Your A1C level is 5.7?6.4% (39-46 mmol/mol). Your OGTT result is 140?199 mg/dL (9.6-04 mmol/L). These blood tests may need to be done again to be sure of the diagnosis. How is this treated? Treatment may include making changes to your diet and lifestyle. These changes can help lower your blood sugar and keep you  from getting type 2 diabetes. In some cases, medicine may be given to help lower your risk. Follow these instructions at home: Eating and drinking  Eat and drink as told. Follow a healthy meal plan. This includes eating lean proteins, whole grains, legumes, fresh fruits and vegetables, low-fat dairy products, and healthy fats. Meet with an expert in healthy eating called a dietitian. This person can help create a healthy eating plan that's right for you. Lifestyle Do moderate-intensity exercise. Do this for at least 30 minutes a day on 5 or more days each week, or as told by your health care provider. A mix of activities may be best. Good choices include brisk walking, swimming, biking, and weight lifting. Try to lose weight if your provider says it's OK. Losing 5-7% of your body weight can help reverse insulin resistance. Do not drink alcohol if: Your provider tells you not to drink. You're pregnant, may be pregnant, or plan to become pregnant. If you drink alcohol: Limit how much you have to: 0-1 drink a day if you're male. 0-2 drinks a day if you're male. Know how much alcohol is in your drink. In the [place] one drink is one 12 oz bottle of beer (355 mL), one 5 oz glass of wine (148 mL), or one 1 oz glass of hard liquor (44 mL). General instructions Take medicines only as told. You may be given medicines that help lower the risk of type 2 diabetes. Do not smoke, vape, or use nicotine or tobacco. Where to find more information American Diabetes Association: diabetes.org/about-diabetes/prediabetes Academy of Nutrition and Dietetics: eatright.org American Heart Association: Go to ThisJobs.cz. Click the search icon. Type "prediabetes" in the search box. Contact a health care provider if: You have any of these symptoms: Increased hunger. Peeing more often than usual. Increased thirst. Feeling tired. Changes in how you see, like blurry vision. Feeling like you may throw  up. Throwing up. Get help right away if: You have shortness of breath. You feel confused. This information is not intended to replace advice given to you by your health care provider. Make sure you discuss any questions you have with your health care provider. Document Revised: 10/07/2022 Document Reviewed: 10/07/2022 Elsevier Patient Education  2024 [employer].

## 2023-11-29 ENCOUNTER — Encounter: Payer: Self-pay | Admitting: Nurse Practitioner

## 2023-11-29 ENCOUNTER — Ambulatory Visit: Payer: 59 | Admitting: Nurse Practitioner

## 2023-11-29 VITALS — BP 113/79 | HR 98 | Temp 98.6°F | Resp 15 | Ht 72.01 in | Wt 184.2 lb

## 2023-11-29 DIAGNOSIS — F900 Attention-deficit hyperactivity disorder, predominantly inattentive type: Secondary | ICD-10-CM | POA: Diagnosis not present

## 2023-11-29 DIAGNOSIS — E7800 Pure hypercholesterolemia, unspecified: Secondary | ICD-10-CM

## 2023-11-29 DIAGNOSIS — R7309 Other abnormal glucose: Secondary | ICD-10-CM

## 2023-11-29 DIAGNOSIS — Z0000 Encounter for general adult medical examination without abnormal findings: Secondary | ICD-10-CM

## 2023-11-29 DIAGNOSIS — N400 Enlarged prostate without lower urinary tract symptoms: Secondary | ICD-10-CM

## 2023-11-29 DIAGNOSIS — F3342 Major depressive disorder, recurrent, in full remission: Secondary | ICD-10-CM

## 2023-11-29 DIAGNOSIS — K219 Gastro-esophageal reflux disease without esophagitis: Secondary | ICD-10-CM

## 2023-11-29 DIAGNOSIS — Z23 Encounter for immunization: Secondary | ICD-10-CM

## 2023-11-29 DIAGNOSIS — G43009 Migraine without aura, not intractable, without status migrainosus: Secondary | ICD-10-CM

## 2023-11-29 DIAGNOSIS — N4889 Other specified disorders of penis: Secondary | ICD-10-CM

## 2023-11-29 LAB — BAYER DCA HB A1C WAIVED: HB A1C (BAYER DCA - WAIVED): 5.6 % (ref 4.8–5.6)

## 2023-11-29 NOTE — Assessment & Plan Note (Signed)
 Chronic, ongoing.  Followed by [facility] -- continue this collaboration and medications as ordered by them.  Attempt to get notes.

## 2023-11-29 NOTE — Assessment & Plan Note (Signed)
 Chronic, ongoing.  Denies SI/HI.  Followed by [facility] -- continue this collaboration and medications as ordered by them.  Attempt to get notes.

## 2023-11-29 NOTE — Assessment & Plan Note (Signed)
 Chronic, ongoing.  Continue Omeprazole  daily and check Mag level as needed.  Risks of PPI use were discussed with patient including bone loss, C. Diff diarrhea, pneumonia, infections, CKD, electrolyte abnormalities.  Verbalizes understanding and chooses to continue the medication.

## 2023-11-29 NOTE — Progress Notes (Signed)
 BP 113/79 (Cuff Size: Normal)   Pulse 98   Temp 98.6 F (37 C)   Resp 15   Ht 6' 0.01 (1.829 m)   Wt 184 lb 3.2 oz (83.6 kg)   SpO2 97%   BMI 24.98 kg/m    Subjective:    Patient ID: Douglas Landry, male    DOB: [birth year], 53 y.o.   MRN: [id number]  HPI: Douglas Landry is a 53 y.o. male presenting on 11/29/2023 for comprehensive medical examination. Current medical complaints include:none  He currently lives with: wife Interim Problems from his last visit: no  Continues to take Omeprazole  for heart burn, offers benefit.  The 10-year ASCVD risk score (Arnett DK, et al., 2019) is: 3%   Values used to calculate the score:     Age: 19 years     Clincally relevant sex: Male     Is Non-Hispanic African American: No     Diabetic: No     Tobacco smoker: No     Systolic Blood Pressure: 113 mmHg     Is BP treated: No     HDL Cholesterol: 47 mg/dL     Total Cholesterol: 175 mg/dL  Impaired Fasting Glucose HbA1C:  Lab Results  Component Value Date   HGBA1C 5.7 (H) 11/13/2022  Duration of elevated blood sugar: chronic Polydipsia: no Polyuria: no Weight change: no Visual disturbance: no Glucose Monitoring: no    Accucheck frequency: Not Checking    Fasting glucose:     Post prandial:  Diabetic Education: Not Completed Family history of diabetes: yes  MIGRAINES Was followed by headache clinic in [place], but has not seen in over one year. Previously  took Emgality & Zonegran, but this only helped aura and not headaches. He also received injections into neck.  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  On review has taken Aimovig , Propranolol , and Imitrex  in past.   Duration: chronic Onset: gradual Severity: 10/10 Quality: dull, aching, and throbbing Frequency: intermittent Location: starts around neck and into anterior head Headache duration: 6 to  24 hours  Radiation: no Time of day headache occurs: middle of night often, sometimes in the morning Alleviating factors: injections and medications Aggravating factors: chocolate, not drinking caffeine   Headache status at time of visit: constantly has mild headache Treatments attempted: Aimovig , Propranolol , and Imitrex  Aura: yes Nausea:  no Vomiting: no Photophobia:  yes Phonophobia: no Effect on social functioning:  no Numbers of missed days of school/work each month: 0 Confusion:  no Gait disturbance/ataxia:  no Behavioral changes:  no Fevers:  no    DEPRESSION & ADHD Follows with psychiatry every 6 months, [facility]. Taking Wellbutrin . Taking Wellbutrin  and Adderall. Mood status: stable Satisfied with current treatment?: yes Symptom severity: moderate  Duration of current treatment : chronic Side effects: no Medication compliance: good compliance Psychotherapy/counseling: none Previous psychiatric medications: multiple medications Depressed mood: occasional Anxious mood: occasional Anhedonia: not as much Significant weight loss or gain: no Insomnia: yes hard to fall asleep sometimes Fatigue:  occasional  Feelings of worthlessness or guilt: no Impaired concentration/indecisiveness: yes Suicidal ideations: no Hopelessness: no Crying spells: no    11/29/2023    8:20 AM 05/16/2023    8:16 AM 12/11/2022   10:46 AM 11/13/2022    8:48 AM 08/10/2022   10:59 AM  Depression screen PHQ 2/9  Decreased Interest 1 0  1 1 1   Down, Depressed, Hopeless 1 1 1 1 1   PHQ - 2 Score 2 1 2 2 2   Altered sleeping 2 1 1 1 1   Tired, decreased energy 1 3 1 1 1   Change in appetite 0 0 1 0 0  Feeling bad or failure about yourself  1 2 2 2 3   Trouble concentrating 1 2 1 2 2   Moving slowly or fidgety/restless 1 0 1 1 0  Suicidal thoughts 0 0 0 0 0  PHQ-9 Score 8 9 9 9 9   Difficult doing work/chores Somewhat difficult Very difficult Somewhat difficult Somewhat difficult Very difficult        11/29/2023    8:20 AM 05/16/2023    8:17 AM 12/11/2022   10:48 AM 11/13/2022    8:49 AM  GAD 7 : Generalized Anxiety Score  Nervous, Anxious, on Edge 1 2 2 2   Control/stop worrying 0 1 2 1   Worry too much - different things 1 2 1 1   Trouble relaxing 2 2 1 3   Restless 1 1 1 1   Easily annoyed or irritable 1 1 2 1   Afraid - awful might happen 1 0 1 2  Total GAD 7 Score 7 9 10 11   Anxiety Difficulty Somewhat difficult Very difficult Very difficult    Functional Status Survey: Is the patient deaf or have difficulty hearing?: No Does the patient have difficulty seeing, even when wearing glasses/contacts?: No Does the patient have difficulty concentrating, remembering, or making decisions?: No Does the patient have difficulty walking or climbing stairs?: No Does the patient have difficulty dressing or bathing?: No Does the patient have difficulty doing errands alone such as visiting a doctor's office or shopping?: No  FALL RISK:    11/29/2023    8:20 AM 05/16/2023    8:16 AM 12/11/2022   10:46 AM 11/13/2022    8:48 AM 01/18/2022    8:53 AM  Fall Risk   Falls in the past year? 0 0 0 0 0  Number falls in past yr: 0 0 0 0 0  Injury with Fall? 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   buPROPion  (WELLBUTRIN  XL) 150 MG 24 hr tablet Take 150 mg by  mouth every morning.   buPROPion  (WELLBUTRIN  XL) 300 MG 24 hr tablet Take 1 tablet (300 mg total) by mouth daily.   omeprazole  (PRILOSEC) 20 MG capsule Take 1 capsule (20 mg total) by mouth daily.   No current facility-administered medications on file prior to visit.    Allergies:  No Known Allergies  Social History:  Social History   Socioeconomic History   Marital status: Married    Spouse name: Not on file   Number of children: 3   Years of education: Not on file   Highest education level: Not on file  Occupational History   Occupation: IT specialist  Tobacco Use   Smoking status: Never   Smokeless tobacco: Never  Vaping Use   Vaping status: Never Used  Substance and Sexual Activity   Alcohol use: No   Drug use: No   Sexual activity: Yes    Partners: Female  Other Topics Concern   Not on file  Social History Narrative   3 daughters   Social Drivers of Health   Financial Resource Strain: Medium Risk (11/29/2023)   Overall Financial Resource Strain (CARDIA)    Difficulty of Paying Living Expenses: Somewhat hard  Food Insecurity: No Food Insecurity (01/18/2022)   Hunger Vital Sign    Worried About Running Out of Food in the Last Year: Never true    Ran Out of Food in the Last Year: Never true  Transportation Needs: No Transportation Needs (01/18/2022)   PRAPARE - Administrator, Civil Service (Medical): No    Lack of Transportation (Non-Medical): No  Physical Activity: Insufficiently Active (11/29/2023)   Exercise Vital Sign    Days of Exercise per Week: 1 day    Minutes of  Exercise per Session: 30 min  Stress: Stress Concern Present (11/29/2023)   [employer] of Occupational Health - Occupational Stress Questionnaire    Feeling of Stress: Rather much  Social Connections: Moderately Integrated (11/29/2023)   Social Connection and Isolation Panel    Frequency of Communication with Friends and Family: Three times a week    Frequency of Social Gatherings with Friends and Family: Once a week    Attends Religious Services: More than 4 times per year    Active Member of [employer] or Organizations: No    Attends Engineer, structural: Not on file    Marital Status: Married  Catering manager Violence: Not At Risk (11/29/2023)   Humiliation, Afraid, Rape, and Kick questionnaire    Fear of Current or Ex-Partner: No    Emotionally Abused: No    Physically Abused: No    Sexually Abused: No   Social History   Tobacco Use  Smoking Status Never  Smokeless Tobacco Never   Social History   Substance and Sexual Activity  Alcohol Use No    Family History:  Family History  Problem Relation Age of Onset   Arthritis Mother    Hyperlipidemia Mother    Hypertension Mother    Depression Mother    Heart attack Father        stents placed   Heart disease Father    Depression Brother    Dementia Maternal Grandmother    Arthritis Maternal Grandfather    Suicidality Paternal Grandfather    Colon cancer Neg Hx    Esophageal cancer Neg Hx    Rectal cancer Neg Hx    Stomach cancer Neg Hx     Past medical history, surgical history, medications, allergies, family history  and social history reviewed with patient today and changes made to appropriate areas of the chart.   ROS All other ROS negative except what is listed above and in the HPI.      Objective:    BP 113/79 (Cuff Size: Normal)   Pulse 98   Temp 98.6 F (37 C)   Resp 15   Ht 6' 0.01 (1.829 m)   Wt 184 lb 3.2 oz (83.6 kg)   SpO2 97%   BMI 24.98 kg/m   Wt Readings from Last 3  Encounters:  11/29/23 184 lb 3.2 oz (83.6 kg)  05/16/23 182 lb 12.8 oz (82.9 kg)  12/11/22 175 lb 12.8 oz (79.7 kg)    Physical Exam Vitals and nursing note reviewed.  Constitutional:      General: He is awake. He is not in acute distress.    Appearance: He is well-developed and well-groomed. He is not ill-appearing or toxic-appearing.  HENT:     Head: Normocephalic and atraumatic.     Right Ear: Hearing, tympanic membrane, ear canal and external ear normal. No drainage.     Left Ear: Hearing, tympanic membrane, ear canal and external ear normal. No drainage.     Nose: Nose normal.     Mouth/Throat:     Pharynx: Uvula midline.  Eyes:     General: Lids are normal.        Right eye: No discharge.        Left eye: No discharge.     Extraocular Movements: Extraocular movements intact.     Conjunctiva/sclera: Conjunctivae normal.     Pupils: Pupils are equal, round, and reactive to light.     Visual Fields: Right eye visual fields normal and left eye visual fields normal.  Neck:     Thyroid: No thyromegaly.     Vascular: No carotid bruit or JVD.     Trachea: Trachea normal.  Cardiovascular:     Rate and Rhythm: Normal rate and regular rhythm.     Heart sounds: Normal heart sounds, S1 normal and S2 normal. No murmur heard.    No gallop.  Pulmonary:     Effort: Pulmonary effort is normal. No accessory muscle usage or respiratory distress.     Breath sounds: Normal breath sounds.  Abdominal:     General: Bowel sounds are normal.     Palpations: Abdomen is soft. There is no hepatomegaly or splenomegaly.     Tenderness: There is no abdominal tenderness.  Musculoskeletal:        General: Normal range of motion.     Cervical back: Normal range of motion and neck supple.     Right lower leg: No edema.     Left lower leg: No edema.  Lymphadenopathy:     Head:     Right side of head: No submental, submandibular, tonsillar, preauricular or posterior auricular adenopathy.     Left side  of head: No submental, submandibular, tonsillar, preauricular or posterior auricular adenopathy.     Cervical: No cervical adenopathy.  Skin:    General: Skin is warm and dry.     Capillary Refill: Capillary refill takes less than 2 seconds.     Findings: No rash.  Neurological:     Mental Status: He is alert and oriented to person, place, and time.     Gait: Gait is intact.     Deep Tendon Reflexes: Reflexes are normal and symmetric.     Reflex Scores:  Brachioradialis reflexes are 2+ on the right side and 2+ on the left side.      Patellar reflexes are 2+ on the right side and 2+ on the left side. Psychiatric:        Attention and Perception: Attention normal.        Mood and Affect: Mood normal.        Speech: Speech normal.        Behavior: Behavior normal. Behavior is cooperative.        Thought Content: Thought content normal.        Cognition and Memory: Cognition normal.     Results for orders placed or performed in visit on 12/11/22  Comprehensive metabolic panel   Collection Time: 12/11/22 11:18 AM  Result Value Ref Range   Glucose 94 70 - 99 mg/dL   BUN 14 6 - 24 mg/dL   Creatinine, Ser 8.71 (H) 0.76 - 1.27 mg/dL   eGFR 68 >40 fO/fpw/8.26   BUN/Creatinine Ratio 11 9 - 20   Sodium 140 134 - 144 mmol/L   Potassium 4.2 3.5 - 5.2 mmol/L   Chloride 106 96 - 106 mmol/L   CO2 23 20 - 29 mmol/L   Calcium 9.8 8.7 - 10.2 mg/dL   Total Protein 7.1 6.0 - 8.5 g/dL   Albumin 4.9 3.8 - 4.9 g/dL   Globulin, Total 2.2 1.5 - 4.5 g/dL   Bilirubin Total 0.4 0.0 - 1.2 mg/dL   Alkaline Phosphatase 120 44 - 121 IU/L   AST 26 0 - 40 IU/L   ALT 41 0 - 44 IU/L  CBC with Differential/Platelet   Collection Time: 12/11/22 11:18 AM  Result Value Ref Range   WBC 5.9 3.4 - 10.8 x10E3/uL   RBC 5.11 4.14 - 5.80 x10E6/uL   Hemoglobin 15.4 13.0 - 17.7 g/dL   Hematocrit 53.6 62.4 - 51.0 %   MCV 91 79 - 97 fL   MCH 30.1 26.6 - 33.0 pg   MCHC 33.3 31.5 - 35.7 g/dL   RDW 87.4 88.3 - 84.5  %   Platelets 213 150 - 450 x10E3/uL   Neutrophils 58 Not Estab. %   Lymphs 29 Not Estab. %   Monocytes 9 Not Estab. %   Eos 3 Not Estab. %   Basos 1 Not Estab. %   Neutrophils Absolute 3.5 1.4 - 7.0 x10E3/uL   Lymphocytes Absolute 1.7 0.7 - 3.1 x10E3/uL   Monocytes Absolute 0.5 0.1 - 0.9 x10E3/uL   EOS (ABSOLUTE) 0.2 0.0 - 0.4 x10E3/uL   Basophils Absolute 0.0 0.0 - 0.2 x10E3/uL   Immature Granulocytes 0 Not Estab. %   Immature Grans (Abs) 0.0 0.0 - 0.1 x10E3/uL  Gamma GT   Collection Time: 12/11/22 11:18 AM  Result Value Ref Range   GGT 18 0 - 65 IU/L  TSH   Collection Time: 12/11/22 11:18 AM  Result Value Ref Range   TSH 2.200 0.450 - 4.500 uIU/mL  Cdiff NAA+O+P+Stool Culture   Collection Time: 12/12/22  9:05 AM   Specimen: Stool   ST     CD- [id number]  Result Value Ref Range   Salmonella/Shigella Screen Final report    Stool Culture result 1 (RSASHR) Comment    Campylobacter Culture Final report    Stool Culture result 1 (CMPCXR) Comment    E coli, Shiga toxin Assay Negative Negative   OVA + PARASITE EXAM Final report    O&P result 1 Comment    Toxigenic C. Difficile by  PCR Negative Negative  H. pylori antigen, stool   Collection Time: 12/12/22  9:05 AM  Result Value Ref Range   H pylori Ag, Stl Negative Negative      Assessment & Plan:   Problem List Items Addressed This Visit       Cardiovascular and Mediastinum   Migraine headache without aura (Chronic)   Chronic, ongoing.  Followed by headache clinic in [place] in past, not currently seeing.  Will attempt to get notes from clinic.  Recommend he schedule follow-up with them ASAP.      Relevant Orders   CBC with Differential/Platelet     Digestive   Acid reflux (Chronic)   Chronic, ongoing.  Continue Omeprazole  daily and check Mag level as needed.  Risks of PPI use were discussed with patient including bone loss, C. Diff diarrhea, pneumonia, infections, CKD, electrolyte abnormalities.  Verbalizes  understanding and chooses to continue the medication.         Other   Recurrent major depressive disorder, in full remission (HCC) - Primary (Chronic)   Chronic, ongoing.  Denies SI/HI.  Followed by [facility] -- continue this collaboration and medications as ordered by them.  Attempt to get notes.      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 The 10-year ASCVD risk score (Arnett DK, et al., 2019) is: 3%   Values used to calculate the score:     Age: 39 years     Clincally relevant sex: Male     Is Non-Hispanic African American: No     Diabetic: No     Tobacco smoker: No     Systolic Blood Pressure: 113 mmHg     Is BP treated: No     HDL Cholesterol: 47 mg/dL     Total Cholesterol: 175 mg/dL       Relevant Orders   Comprehensive metabolic panel with GFR   Lipid Panel w/o Chol/HDL Ratio   Elevated hemoglobin A1c measurement (Chronic)   Noted on past labs, check CMP and A1c today.  Adjust plan as needed.      Relevant Orders   Bayer DCA Hb A1c Waived   Attention deficit hyperactivity disorder (ADHD) (Chronic)   Chronic, ongoing.  Followed by [facility] -- continue this collaboration and medications as ordered by them.  Attempt to get notes.      Other Visit Diagnoses       Benign prostatic hyperplasia without lower urinary tract symptoms       PSA on labs today.   Relevant Orders   PSA     Penis pain       Referral to urology placed.   Relevant Orders   Ambulatory referral to Urology     Pneumococcal vaccination given       PCV20 in office today, educated patient.   Relevant Orders   Pneumococcal conjugate vaccine 20-valent (Completed)     Flu vaccine need       Flu vaccine today, educated patient.   Relevant Orders   Flu vaccine trivalent PF, 6mos and older(Flulaval,Afluria,Fluarix,Fluzone) (Completed)     Encounter for  annual physical exam       Annual physical today, health maintenance reviewed with patient.       Discussed aspirin prophylaxis for myocardial infarction prevention and decision was it was not indicated  LABORATORY TESTING:  Health  maintenance labs ordered today as discussed above.   The natural history of prostate cancer and ongoing controversy regarding screening and potential treatment outcomes of prostate cancer has been discussed with the patient. The meaning of a false positive PSA and a false negative PSA has been discussed. He indicates understanding of the limitations of this screening test and wishes to proceed with screening PSA testing.  IMMUNIZATIONS:   - Tdap: Tetanus vaccination status reviewed: last tetanus booster within 10 years. - Influenza: Administered today - Pneumovax: Not applicable - Prevnar:PCV20 today - Zostavax vaccine: Given elsewhere at [facility] had one dose per report  SCREENING: - Colonoscopy: Up to date due next on 1/13/20232 Discussed with patient purpose of the colonoscopy is to detect colon cancer at curable precancerous or early stages   - AAA Screening: Not applicable  -Hearing Test: Not applicable  -Spirometry: Not applicable   PATIENT COUNSELING:    Sexuality: Discussed sexually transmitted diseases, partner selection, use of condoms, avoidance of unintended pregnancy  and contraceptive alternatives.   Advised to avoid cigarette smoking.  I discussed with the patient that most people either abstain from alcohol or drink within safe limits (<=14/week and <=4 drinks/occasion for males, <=7/weeks and <= 3 drinks/occasion for females) and that the risk for alcohol disorders and other health effects rises proportionally with the number of drinks per week and how often a drinker exceeds daily limits.  Discussed cessation/primary prevention of drug use and availability of treatment for abuse.   Diet: Encouraged to adjust caloric intake to maintain   or achieve ideal body weight, to reduce intake of dietary saturated fat and total fat, to limit sodium intake by avoiding high sodium foods and not adding table salt, and to maintain adequate dietary potassium and calcium preferably from fresh fruits, vegetables, and low-fat dairy products.    Stressed the importance of regular exercise  Injury prevention: Discussed safety belts, safety helmets, smoke detector, smoking near bedding or upholstery.   Dental health: Discussed importance of regular tooth brushing, flossing, and dental visits.   Follow up plan: NEXT PREVENTATIVE PHYSICAL DUE IN 1 YEAR. Return in about 1 year (around 11/28/2024) for Annual Physical.

## 2023-11-29 NOTE — Assessment & Plan Note (Signed)
 Chronic, ongoing.  Followed by headache clinic in [place] in past, not currently seeing.  Will attempt to get notes from clinic.  Recommend he schedule follow-up with them ASAP.

## 2023-11-29 NOTE — Assessment & Plan Note (Signed)
 Noted on past labs, check lipid panel today and initiate medication as needed. The 10-year ASCVD risk score (Arnett DK, et al., 2019) is: 3%   Values used to calculate the score:     Age: 53 years     Clincally relevant sex: Male     Is Non-Hispanic African American: No     Diabetic: No     Tobacco smoker: No     Systolic Blood Pressure: 113 mmHg     Is BP treated: No     HDL Cholesterol: 47 mg/dL     Total Cholesterol: 175 mg/dL

## 2023-11-29 NOTE — Assessment & Plan Note (Signed)
Noted on past labs, check CMP and A1c today.  Adjust plan as needed.

## 2023-11-30 ENCOUNTER — Ambulatory Visit: Payer: Self-pay | Admitting: Nurse Practitioner

## 2023-11-30 LAB — CBC WITH DIFFERENTIAL/PLATELET
Basophils Absolute: 0 x10E3/uL (ref 0.0–0.2)
Basos: 0 %
EOS (ABSOLUTE): 0.2 x10E3/uL (ref 0.0–0.4)
Eos: 3 %
Hematocrit: 48.2 % (ref 37.5–51.0)
Hemoglobin: 15.3 g/dL (ref 13.0–17.7)
Immature Grans (Abs): 0 x10E3/uL (ref 0.0–0.1)
Immature Granulocytes: 0 %
Lymphocytes Absolute: 2 x10E3/uL (ref 0.7–3.1)
Lymphs: 30 %
MCH: 29.3 pg (ref 26.6–33.0)
MCHC: 31.7 g/dL (ref 31.5–35.7)
MCV: 92 fL (ref 79–97)
Monocytes Absolute: 0.6 x10E3/uL (ref 0.1–0.9)
Monocytes: 9 %
Neutrophils Absolute: 4 x10E3/uL (ref 1.4–7.0)
Neutrophils: 58 %
Platelets: 207 x10E3/uL (ref 150–450)
RBC: 5.23 x10E6/uL (ref 4.14–5.80)
RDW: 12.6 % (ref 11.6–15.4)
WBC: 6.9 x10E3/uL (ref 3.4–10.8)

## 2023-11-30 LAB — TSH: TSH: 2.31 u[IU]/mL (ref 0.450–4.500)

## 2023-11-30 LAB — COMPREHENSIVE METABOLIC PANEL WITH GFR
ALT: 30 IU/L (ref 0–44)
AST: 21 IU/L (ref 0–40)
Albumin: 4.7 g/dL (ref 3.8–4.9)
Alkaline Phosphatase: 106 IU/L (ref 44–121)
BUN/Creatinine Ratio: 10 (ref 9–20)
BUN: 14 mg/dL (ref 6–24)
Bilirubin Total: 0.6 mg/dL (ref 0.0–1.2)
CO2: 25 mmol/L (ref 20–29)
Calcium: 9.5 mg/dL (ref 8.7–10.2)
Chloride: 101 mmol/L (ref 96–106)
Creatinine, Ser: 1.35 mg/dL — ABNORMAL HIGH (ref 0.76–1.27)
Globulin, Total: 2.3 g/dL (ref 1.5–4.5)
Glucose: 91 mg/dL (ref 70–99)
Potassium: 4.6 mmol/L (ref 3.5–5.2)
Sodium: 141 mmol/L (ref 134–144)
Total Protein: 7 g/dL (ref 6.0–8.5)
eGFR: 63 mL/min/1.73 (ref >59)

## 2023-11-30 LAB — PSA: Prostate Specific Ag, Serum: 0.8 ng/mL (ref 0.0–4.0)

## 2023-11-30 LAB — LIPID PANEL W/O CHOL/HDL RATIO
Cholesterol, Total: 220 mg/dL — ABNORMAL HIGH (ref 100–199)
HDL: 55 mg/dL (ref >39)
LDL Chol Calc (NIH): 137 mg/dL — ABNORMAL HIGH (ref 0–99)
Triglycerides: 160 mg/dL — ABNORMAL HIGH (ref 0–149)
VLDL Cholesterol Cal: 28 mg/dL (ref 5–40)

## 2024-01-08 ENCOUNTER — Encounter: Payer: Self-pay | Admitting: Urology

## 2024-01-08 ENCOUNTER — Ambulatory Visit (INDEPENDENT_AMBULATORY_CARE_PROVIDER_SITE_OTHER): Admitting: Urology

## 2024-01-08 VITALS — BP 106/77 | HR 70 | Ht 72.0 in | Wt 185.0 lb

## 2024-01-08 DIAGNOSIS — N486 Induration penis plastica: Secondary | ICD-10-CM

## 2024-01-08 DIAGNOSIS — N500 Atrophy of testis: Secondary | ICD-10-CM | POA: Diagnosis not present

## 2024-01-08 NOTE — Progress Notes (Signed)
 01/08/2024 8:47 AM   Douglas Landry November 07, 1970 [id number]  Referring provider: Valerio Melanie DASEN, NP [address] [place],  [place] [address]  Chief Complaint  Patient presents with   Penis Pain    HPI: Douglas Landry is a 53 y.o. male referred for evaluation of penile pain.  Noted a firm penile mass dorsum of penis ~1 year ago which has increased in size Mild pain with erections Long history of ED but when he does have an erection notes dorsal curvature estimated at 15 degrees No bothersome LUTS Denies prior history of urologic problems Does complain of chronic tiredness, fatigue and depressed mood   PMH: 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   Chicken pox    Depression    Frequent headaches    GERD (gastroesophageal reflux disease)    Migraines     Surgical History: Past Surgical History:  Procedure Laterality Date   CATARACT EXTRACTION     right eye as an infant - blind in right eye   EYE SURGERY     lens trasplant right eye [phone]   FRACTURE SURGERY Left    left hand fracture reset 2017   WISDOM TOOTH EXTRACTION      Home Medications:  Allergies as of 01/08/2024   No Known Allergies      Medication List        Accurate as of January 08, 2024  8:47 AM. If you have any questions, ask your nurse or doctor.          amphetamine-dextroamphetamine 20 MG 24 hr capsule Commonly known as: ADDERALL XR Take 20 mg by mouth every morning.   buPROPion  300 MG 24 hr tablet Commonly known as: WELLBUTRIN  XL Take 1 tablet (300 mg total) by mouth daily.   buPROPion  150 MG 24 hr tablet Commonly known as: WELLBUTRIN  XL Take 150 mg by mouth every morning.   omeprazole  20 MG capsule Commonly known as: PRILOSEC Take 1 capsule (20 mg total) by mouth daily.        Allergies: No Known Allergies  Family History: Family History  Problem  Relation Age of Onset   Arthritis Mother    Hyperlipidemia Mother    Hypertension Mother    Depression Mother    Heart attack Father        stents placed   Heart disease Father    Depression Brother    Dementia Maternal Grandmother    Arthritis Maternal Grandfather    Suicidality Paternal Grandfather    Colon cancer Neg Hx    Esophageal cancer Neg Hx    Rectal cancer Neg Hx    Stomach cancer Neg Hx     Social History:  reports that he has never smoked. He has never used smokeless tobacco. He reports that he does not drink alcohol and does not use drugs.   Physical Exam: BP 106/77   Pulse 70   Ht 6' (1.829 m)   Wt 185 lb (83.9 kg)   BMI 25.09 kg/m   Constitutional:  Alert, No acute distress. HEENT: [place] AT Respiratory: Normal respiratory effort, no increased work of breathing. GU: ~15 mm distal shaft plaque dorsally.  Bilateral testicular atrophy with estimated volume 8 cc bilaterally Psychiatric: Normal mood and affect.   Assessment & Plan:    1.  Peyronie's disease We discussed the pathophysiology of Peyronie's disease.  Current penile curvature is ~15 degrees and treatment not recommended.  We discussed penile pain is common with active phase Peyronie's and should resolve  2.  Testicular atrophy Does have tiredness, fatigue, depression Testosterone level drawn   Glendia JAYSON Barba, MD  [facility] [address] [place], [place] [address] [phone]

## 2024-01-09 ENCOUNTER — Ambulatory Visit: Payer: Self-pay | Admitting: Urology

## 2024-01-09 LAB — TESTOSTERONE: Testosterone: 740 ng/dL (ref 264–916)

## 2024-01-14 IMAGING — XA Imaging study
2 series · 2 of 2 positions shown · non-contrast
Comparison: none

CLINICAL DATA: Spinal headache after lumbar puncture last week.

[Series 1: ortho standard · 1 of 1 slices shown (1 of 2)]
[im 1/1]
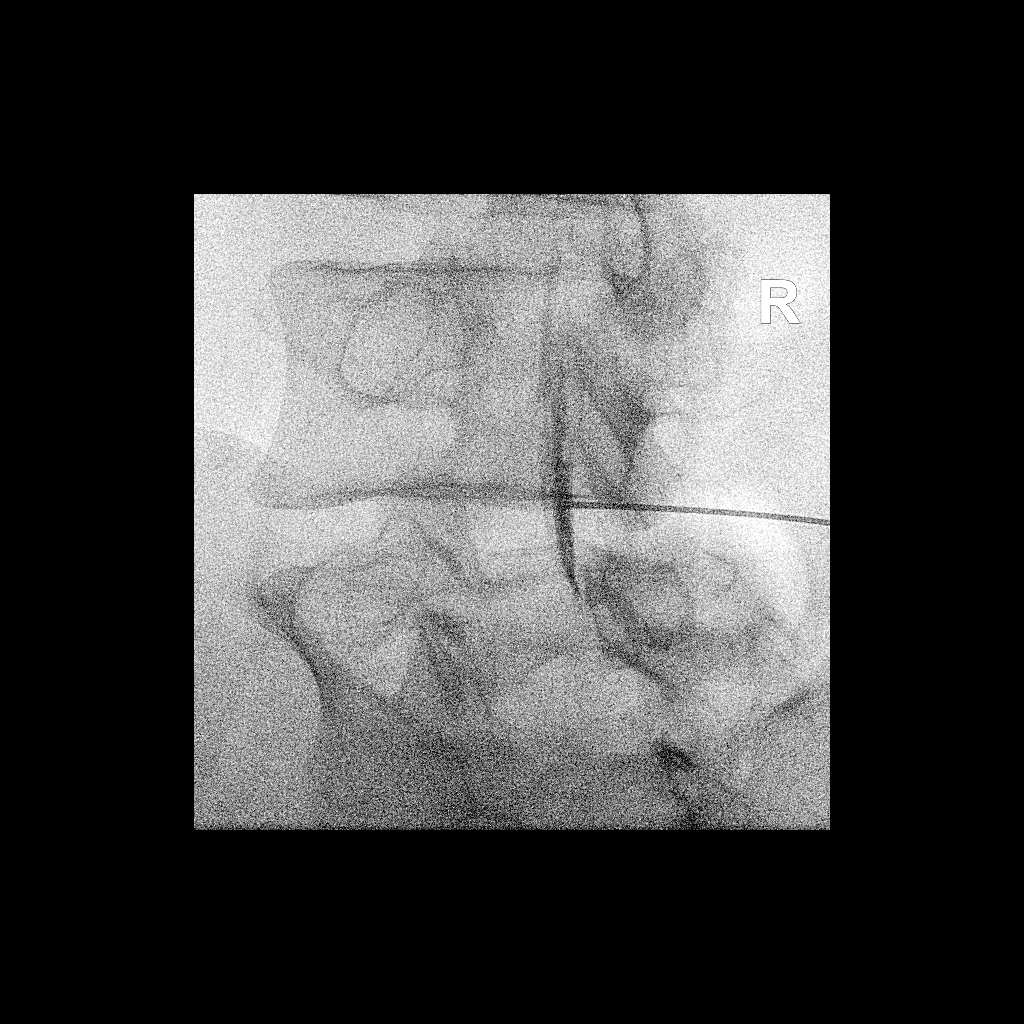

[Series 2: ortho standard · 1 of 1 slices shown (2 of 2)]
[im 1/1]
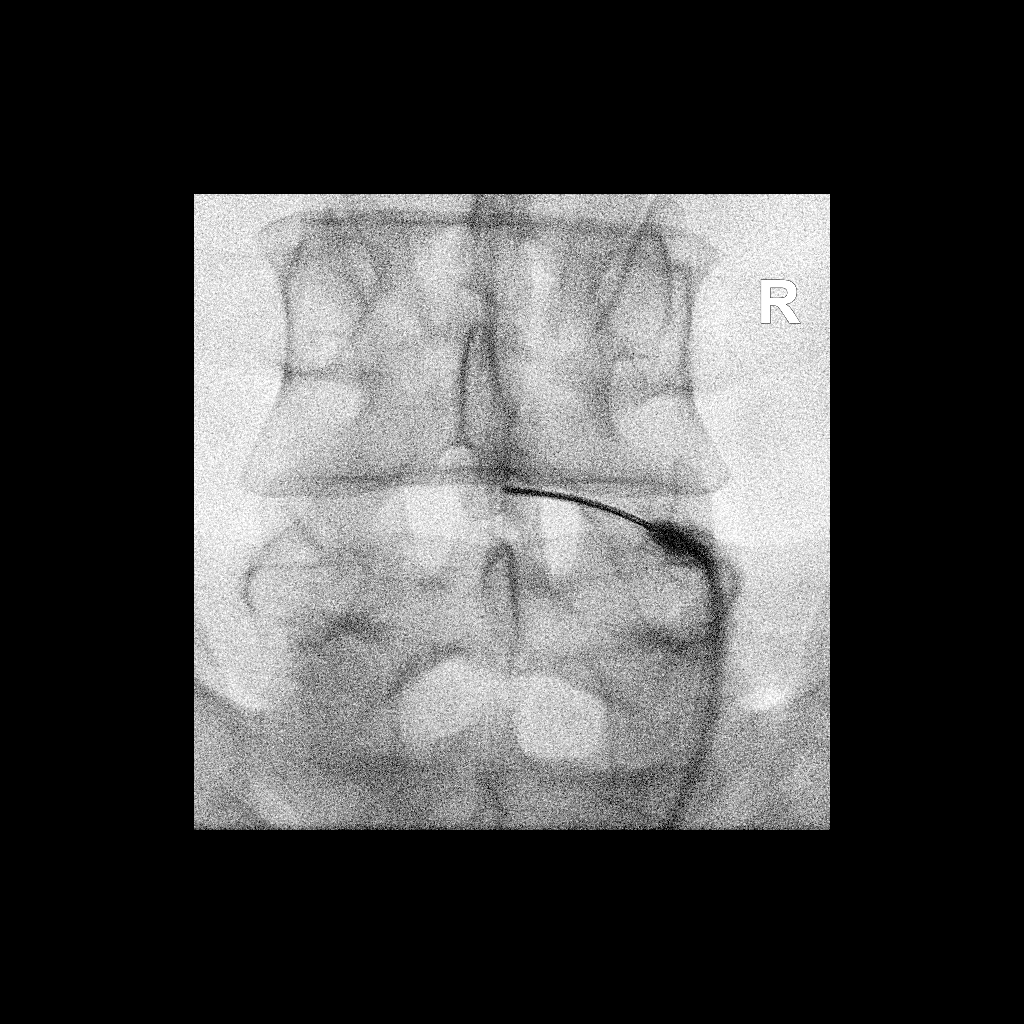

[2 of 2 positions shown; findings below may reference images not displayed]

FLUOROSCOPY TIME:  Radiation Exposure Index (as provided by the
fluoroscopic device): 1.7 mGy Kerma

PROCEDURE:
LUMBAR EPIDURAL BLOOD PATCH INJECTION

After a thorough discussion of risks and benefits of the procedure,
written and verbal consent was obtained.

Prior to the procedure, 20 ml of the patient's blood was harvested
using stringent sterile technique.

An interlaminar approach was performed at L4-L5 on the right. Under
stringent sterile technique, overlying skin was cleansed with
betadine soap and anesthetized with 1% lidocaine without
epinephrine. A 3.5 inch 20 gauge needle was advanced using
loss-of-resistance technique.

DIAGNOSTIC EPIDURAL INJECTION

Injection of Isovue-M 200 shows a good epidural pattern with spread
above and below the level of needle placement, primarily on the side
of needle placement. No vascular or subarachnoid opacification is
seen.

THERAPEUTIC EPIDURAL INJECTION

15 ml of the patient's blood was injected into the epidural space at
the site of prior lumbar puncture.
IMPRESSION: Technically successful lumbar blood patch at L4-L5.

## 2024-04-15 ENCOUNTER — Ambulatory Visit: Admitting: Internal Medicine

## 2024-04-15 ENCOUNTER — Ambulatory Visit: Payer: Self-pay | Admitting: Internal Medicine

## 2024-04-15 ENCOUNTER — Ambulatory Visit: Payer: Self-pay

## 2024-04-15 ENCOUNTER — Encounter: Payer: Self-pay | Admitting: Internal Medicine

## 2024-04-15 ENCOUNTER — Ambulatory Visit
Admission: RE | Admit: 2024-04-15 | Discharge: 2024-04-15 | Disposition: A | Discharge status: Home / Self Care | Source: Ambulatory Visit | Attending: Internal Medicine | Admitting: Internal Medicine

## 2024-04-15 VITALS — BP 118/84 | Ht 72.0 in | Wt 184.8 lb

## 2024-04-15 DIAGNOSIS — K219 Gastro-esophageal reflux disease without esophagitis: Secondary | ICD-10-CM

## 2024-04-15 DIAGNOSIS — K5909 Other constipation: Secondary | ICD-10-CM

## 2024-04-15 DIAGNOSIS — R1013 Epigastric pain: Secondary | ICD-10-CM

## 2024-04-15 DIAGNOSIS — R1012 Left upper quadrant pain: Secondary | ICD-10-CM

## 2024-04-15 DIAGNOSIS — K449 Diaphragmatic hernia without obstruction or gangrene: Secondary | ICD-10-CM

## 2024-04-15 DIAGNOSIS — R1313 Dysphagia, pharyngeal phase: Secondary | ICD-10-CM

## 2024-04-15 NOTE — Telephone Encounter (Signed)
 FYI Only or Action Required?: FYI only for provider: appointment scheduled on 1/28.  Patient was last seen in primary care on 11/29/2023 by Valerio Melanie DASEN, NP.  Called Nurse Triage reporting Abdominal Pain.  Symptoms began several days ago.  Interventions attempted: Rest, hydration, or home remedies.  Symptoms are: gradually worsening.  Triage Disposition: See HCP Within 4 Hours (Or PCP Triage)  Patient/caregiver understands and will follow disposition?: Yes  Message from Lonell PEDLAR sent at 04/15/2024  8:54 AM EST  Reason for Triage: Patient experiencing abdominal pain   Reason for Disposition  [1] MILD-MODERATE pain AND [2] constant AND [3] present > 2 hours  Answer Assessment - Initial Assessment Questions Starting Monday- abdominal pain/cramping midline above the belly button. Nausea intermittently, partial relief with certain positions. Last BM Monday and passing less gas than normal. Denies Fever, CP, SOB, bloody stool, vomiting. Does radiate around to his back. Denies urinary symptoms.  Laxative last night with no relief.  Daughter had stomach bug 1-2weeks ago.  Appt with [facility] to assess. ED/UC precautions understood  1. LOCATION: Where does it hurt?     Above the belly button - midline 2. RADIATION: Does the pain shoot anywhere else? (e.g., chest, back)     O his back  3. ONSET: When did the pain begin? (Minutes, hours or days ago)      Monday  4. SUDDEN: Gradual or sudden onset?     Woke Monday morning  5. PATTERN Does the pain come and go, or is it constant?     Constant but certain positions feel better than others  6. SEVERITY: How bad is the pain?  (e.g., Scale 1-10; mild, moderate, or severe)     3-9/10 7. RECURRENT SYMPTOM: Have you ever had this type of stomach pain before? If Yes, ask: When was the last time? and What happened that time?      denies 8. CAUSE: What do you think is causing the stomach pain? (e.g., gallstones, recent abdominal  surgery)     Daughter sick last week  9. RELIEVING/AGGRAVATING FACTORS: What makes it better or worse? (e.g., antacids, bending or twisting motion, bowel movement)     Certain positions  10. OTHER SYMPTOMS: Do you have any other symptoms? (e.g., back pain, diarrhea, fever, urination pain, vomiting)       Bloated, nausea  Protocols used: Abdominal Pain - Male-A-AH

## 2024-04-15 NOTE — Progress Notes (Signed)
 "  Subjective:    Patient ID: Douglas Landry, male    DOB: 1970-11-20, 54 y.o.   MRN: [id number]  HPI  Discussed the use of AI scribe software for clinical note transcription with the patient, who gave verbal consent to proceed.  Douglas Landry is a 54 year old male with GERD who presents with abdominal pain.  He has been experiencing abdominal pain since Monday morning, with varying intensity from mild to severe. The pain is sometimes sharp and other times achy, and it worsens after eating. He also experiences bloating and a decrease in bowel movements, which is unusual for him. Over-the-counter laxatives have not provided relief.  He has a history of GERD and following an upper endoscopy in 2019, was told he may have a hiatal hernia and had some inflammation in the stomach. He has been on omeprazole  since then. Recently, he has experienced difficulty swallowing, with food feeling like it gets caught in his throat, although he has not actually choked. He also reports occasional reflux symptoms when eating.  He takes omeprazole  once daily. He occasionally uses naproxen for migraines but has not used it for his current abdominal pain. He denies any recent changes in diet or medication, recent travel, or alcohol consumption. He does not smoke and has no family history of stomach or esophageal cancer.  No blood in stool, urinary symptoms, or recent bowel surgeries. He notes a slight decrease in urination but no burning or blood in the urine.      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     buPROPion  (WELLBUTRIN   XL) 150 MG 24 hr tablet Take 150 mg by mouth every morning.     buPROPion  (WELLBUTRIN  XL) 300 MG 24 hr tablet Take 1 tablet (300 mg total) by mouth daily. 90 tablet 0   omeprazole  (PRILOSEC) 20 MG capsule Take 1 capsule (20 mg total) by mouth daily. 30 capsule 11   No current facility-administered medications for this visit.    Allergies[1]  Family History  Problem Relation Age of Onset   Arthritis Mother    Hyperlipidemia Mother    Hypertension Mother    Depression Mother    Heart attack Father        stents placed   Heart disease Father    Depression Brother    Dementia Maternal Grandmother    Arthritis Maternal Grandfather    Suicidality Paternal Grandfather    Colon cancer Neg Hx    Esophageal cancer Neg Hx    Rectal cancer Neg Hx    Stomach cancer Neg Hx     Social History   Socioeconomic History   Marital status: Married    Spouse name: Not on file   Number of children: 3   Years of education: Not on file   Highest education level: Not on file  Occupational History   Occupation: IT specialist  Tobacco Use   Smoking status: Never   Smokeless tobacco: Never  Vaping Use   Vaping status: Never  Used  Substance and Sexual Activity   Alcohol use: No   Drug use: No   Sexual activity: Yes    Partners: Female  Other Topics Concern   Not on file  Social History Narrative   3 daughters   Social Drivers of Health   Tobacco Use: Low Risk (01/08/2024)   Patient History    Smoking Tobacco Use: Never    Smokeless Tobacco Use: Never    Passive Exposure: Not on file  Financial Resource Strain: Medium Risk (11/29/2023)   Overall Financial Resource Strain (CARDIA)    Difficulty of Paying Living Expenses: Somewhat hard  Food Insecurity: No Food Insecurity (01/18/2022)   Hunger Vital Sign    Worried About Running Out of Food in the Last Year: Never true    Ran Out of Food in the Last Year: Never true  Transportation Needs: No Transportation Needs (01/18/2022)   PRAPARE  - Administrator, Civil Service (Medical): No    Lack of Transportation (Non-Medical): No  Physical Activity: Insufficiently Active (11/29/2023)   Exercise Vital Sign    Days of Exercise per Week: 1 day    Minutes of Exercise per Session: 30 min  Stress: Stress Concern Present (11/29/2023)   [employer] of Occupational Health - Occupational Stress Questionnaire    Feeling of Stress: Rather much  Social Connections: Moderately Integrated (11/29/2023)   Social Connection and Isolation Panel    Frequency of Communication with Friends and Family: Three times a week    Frequency of Social Gatherings with Friends and Family: Once a week    Attends Religious Services: More than 4 times per year    Active Member of [employer] or Organizations: No    Attends Banker Meetings: Not on file    Marital Status: Married  Intimate Partner Violence: Not At Risk (11/29/2023)   Epic    Fear of Current or Ex-Partner: No    Emotionally Abused: No    Physically Abused: No    Sexually Abused: No  Depression (PHQ2-9): Medium Risk (11/29/2023)   Depression (PHQ2-9)    PHQ-2 Score: 8  Alcohol Screen: Low Risk (01/18/2022)   Alcohol Screen    Last Alcohol Screening Score (AUDIT): 0  Housing: Low Risk (01/18/2022)   Housing    Last Housing Risk Score: 0  Utilities: Not At Risk (01/18/2022)   AHC Utilities    Threatened with loss of utilities: No  Health Literacy: Not on file     Constitutional: Denies fever, malaise, fatigue, headache or abrupt weight changes.  Respiratory: Denies difficulty breathing, shortness of breath, cough or sputum production.   Cardiovascular: Denies chest pain, chest tightness, palpitations or swelling in the hands or feet.  Gastrointestinal: Patient reports occasional difficulty swallowing, abdominal pain, bloating and constipation.  Denies diarrhea or blood in the stool.  GU: Denies urgency, frequency, pain with urination, burning sensation, blood in  urine, odor or discharge.  No other specific complaints in a complete review of systems (except as listed in HPI above).  Objective   BP 118/84 (BP Location: Left Arm, Patient Position: Sitting, Cuff Size: Normal)   Ht 6' (1.829 m)   Wt 184 lb 12.8 oz (83.8 kg)   BMI 25.06 kg/m   Wt Readings from Last 3 Encounters:  01/08/24 185 lb (83.9 kg)  11/29/23 184 lb 3.2 oz (83.6 kg)  05/16/23 182 lb 12.8 oz (82.9 kg)    General: Appears his stated age, overweight, in NAD. Skin: Warm,  dry and intact.  Cardiovascular: Tachycardic with normal rhythm. S1,S2 noted.  No murmur, rubs or gallops noted.  Pulmonary/Chest: Normal effort and positive vesicular breath sounds. No respiratory distress. No wheezes, rales or ronchi noted.  Abdomen: Soft and tender in the epigastric and LUQ.  Hypoactive bowel sounds.  Tympanic to percussion.  No distention or masses noted. Liver, spleen and kidneys non palpable. Musculoskeletal: No difficulty with gait.  Neurological: Alert and oriented.   BMET    Component Value Date/Time   NA 141 11/29/2023 0842   K 4.6 11/29/2023 0842   CL 101 11/29/2023 0842   CO2 25 11/29/2023 0842   GLUCOSE 91 11/29/2023 0842   GLUCOSE 97 07/16/2016 1056   BUN 14 11/29/2023 0842   CREATININE 1.35 (H) 11/29/2023 0842   CALCIUM 9.5 11/29/2023 0842   GFRNONAA 88 01/27/2019 0933   GFRAA 102 01/27/2019 0933    Lipid Panel     Component Value Date/Time   CHOL 220 (H) 11/29/2023 0842   TRIG 160 (H) 11/29/2023 0842   HDL 55 11/29/2023 0842   CHOLHDL 5 07/16/2016 1056   VLDL 27.6 07/16/2016 1056   LDLCALC 137 (H) 11/29/2023 0842    CBC    Component Value Date/Time   WBC 6.9 11/29/2023 0842   WBC 6.0 07/16/2016 1056   RBC 5.23 11/29/2023 0842   RBC 5.24 07/16/2016 1056   HGB 15.3 11/29/2023 0842   HCT 48.2 11/29/2023 0842   PLT 207 11/29/2023 0842   MCV 92 11/29/2023 0842   MCH 29.3 11/29/2023 0842   MCHC 31.7 11/29/2023 0842   MCHC 33.4 07/16/2016 1056   RDW  12.6 11/29/2023 0842   LYMPHSABS 2.0 11/29/2023 0842   MONOABS 0.6 07/16/2016 1056   EOSABS 0.2 11/29/2023 0842   BASOSABS 0.0 11/29/2023 0842    Hgb A1C Lab Results  Component Value Date   HGBA1C 5.6 11/29/2023       Assessment and Plan    Assessment and Plan    Abdominal pain, bloating with constipation Acute abdominal pain with constipation, possible bowel obstruction considered. - Ordered CBC, metabolic panel, and lipase. - Ordered stat abdominal x-ray. - Advised Miralax, one capful in 8 oz water or juice, twice daily. - Instructed to seek immediate care if pain worsens or gas passage ceases. - ER precautions discussed  Gastroesophageal reflux disease with dysphagia and hiatal hernia GERD with dysphagia, potential esophageal narrowing due to chronic GERD. - Increased omeprazole  20 mg to twice daily. - Consider GI referral if symptoms persist or worsen.        Follow-up with your PCP as previously scheduled  Angeline Laura, NP     [1] No Known Allergies  "

## 2024-04-15 NOTE — Patient Instructions (Signed)

## 2024-04-16 LAB — COMPREHENSIVE METABOLIC PANEL WITH GFR
AG Ratio: 1.6 (calc) (ref 1.0–2.5)
ALT: 23 U/L (ref 9–46)
AST: 20 U/L (ref 10–35)
Albumin: 4.3 g/dL (ref 3.6–5.1)
Alkaline phosphatase (APISO): 92 U/L (ref 35–144)
BUN: 16 mg/dL (ref 7–25)
CO2: 28 mmol/L (ref 20–32)
Calcium: 9.2 mg/dL (ref 8.6–10.3)
Chloride: 100 mmol/L (ref 98–110)
Creat: 1.26 mg/dL (ref 0.70–1.30)
Globulin: 2.7 g/dL (ref 1.9–3.7)
Glucose, Bld: 86 mg/dL (ref 65–139)
Potassium: 4.9 mmol/L (ref 3.5–5.3)
Sodium: 137 mmol/L (ref 135–146)
Total Bilirubin: 0.8 mg/dL (ref 0.2–1.2)
Total Protein: 7 g/dL (ref 6.1–8.1)
eGFR: 69 mL/min/{1.73_m2}

## 2024-04-16 LAB — CBC
HCT: 49.4 % (ref 39.4–51.1)
Hemoglobin: 16 g/dL (ref 13.2–17.1)
MCH: 28.6 pg (ref 27.0–33.0)
MCHC: 32.4 g/dL (ref 31.6–35.4)
MCV: 88.4 fL (ref 81.4–101.7)
MPV: 11.3 fL (ref 7.5–12.5)
Platelets: 214 10*3/uL (ref 140–400)
RBC: 5.59 Million/uL (ref 4.20–5.80)
RDW: 12.4 % (ref 11.0–15.0)
WBC: 5.1 10*3/uL (ref 3.8–10.8)

## 2024-04-16 LAB — LIPASE: Lipase: 24 U/L (ref 7–60)

## 2024-04-17 MED ORDER — OMEPRAZOLE 20 MG PO CPDR: 20.0000 mg | DELAYED_RELEASE_CAPSULE | Freq: Every day | ORAL | 11 refills | Status: AC

## 2024-12-02 ENCOUNTER — Encounter: Admitting: Nurse Practitioner
